# Patient Record
Sex: Male | Born: 1950
Health system: Southern US, Community
[De-identification: ages and names within clinical notes are randomized; demographics above are authoritative.]

## PROBLEM LIST (undated history)

## (undated) DIAGNOSIS — G8929 Other chronic pain: Secondary | ICD-10-CM

## (undated) DIAGNOSIS — K635 Polyp of colon: Secondary | ICD-10-CM

## (undated) DIAGNOSIS — F419 Anxiety disorder, unspecified: Secondary | ICD-10-CM

## (undated) DIAGNOSIS — J40 Bronchitis, not specified as acute or chronic: Secondary | ICD-10-CM

## (undated) DIAGNOSIS — G454 Transient global amnesia: Secondary | ICD-10-CM

## (undated) DIAGNOSIS — M549 Dorsalgia, unspecified: Secondary | ICD-10-CM

## (undated) DIAGNOSIS — H269 Unspecified cataract: Secondary | ICD-10-CM

## (undated) DIAGNOSIS — Z87442 Personal history of urinary calculi: Secondary | ICD-10-CM

## (undated) DIAGNOSIS — E785 Hyperlipidemia, unspecified: Secondary | ICD-10-CM

## (undated) DIAGNOSIS — IMO0002 Reserved for concepts with insufficient information to code with codable children: Secondary | ICD-10-CM

## (undated) DIAGNOSIS — R410 Disorientation, unspecified: Secondary | ICD-10-CM

## (undated) HISTORY — PX: VASECTOMY: SHX75

## (undated) HISTORY — PX: OTHER SURGICAL HISTORY: SHX169

## (undated) HISTORY — DX: Polyp of colon: K63.5

## (undated) HISTORY — PX: COLONOSCOPY: SHX174

## (undated) HISTORY — DX: Unspecified cataract: H26.9

## (undated) HISTORY — DX: Reserved for concepts with insufficient information to code with codable children: IMO0002

## (undated) HISTORY — DX: Hyperlipidemia, unspecified: E78.5

---

## 1998-04-05 ENCOUNTER — Encounter: Admission: RE | Admit: 1998-04-05 | Discharge: 1998-04-29 | Payer: Self-pay | Admitting: Internal Medicine

## 1998-07-11 ENCOUNTER — Encounter: Payer: Self-pay | Admitting: Neurological Surgery

## 1998-07-11 ENCOUNTER — Ambulatory Visit (HOSPITAL_COMMUNITY): Admission: RE | Admit: 1998-07-11 | Discharge: 1998-07-11 | Payer: Self-pay | Admitting: Neurological Surgery

## 1998-08-08 ENCOUNTER — Encounter: Admission: RE | Admit: 1998-08-08 | Discharge: 1998-11-06 | Payer: Self-pay

## 1999-07-10 ENCOUNTER — Encounter: Payer: Self-pay | Admitting: Neurology

## 1999-07-10 ENCOUNTER — Ambulatory Visit (HOSPITAL_COMMUNITY): Admission: RE | Admit: 1999-07-10 | Discharge: 1999-07-10 | Payer: Self-pay | Admitting: Neurology

## 1999-07-31 ENCOUNTER — Encounter: Payer: Self-pay | Admitting: Neurology

## 1999-07-31 ENCOUNTER — Ambulatory Visit (HOSPITAL_COMMUNITY): Admission: RE | Admit: 1999-07-31 | Discharge: 1999-07-31 | Payer: Self-pay | Admitting: Neurology

## 1999-09-04 ENCOUNTER — Encounter: Admission: RE | Admit: 1999-09-04 | Discharge: 1999-09-04 | Payer: Self-pay | Admitting: Internal Medicine

## 1999-09-04 ENCOUNTER — Encounter: Payer: Self-pay | Admitting: Internal Medicine

## 2001-11-24 ENCOUNTER — Encounter: Payer: Self-pay | Admitting: Internal Medicine

## 2001-11-24 ENCOUNTER — Encounter: Admission: RE | Admit: 2001-11-24 | Discharge: 2001-11-24 | Payer: Self-pay | Admitting: Internal Medicine

## 2006-12-13 ENCOUNTER — Observation Stay (HOSPITAL_COMMUNITY): Admission: EM | Admit: 2006-12-13 | Discharge: 2006-12-14 | Payer: Self-pay | Admitting: Emergency Medicine

## 2006-12-13 ENCOUNTER — Ambulatory Visit: Payer: Self-pay | Admitting: Internal Medicine

## 2006-12-13 ENCOUNTER — Encounter: Payer: Self-pay | Admitting: Internal Medicine

## 2006-12-17 ENCOUNTER — Encounter: Payer: Self-pay | Admitting: Internal Medicine

## 2006-12-17 ENCOUNTER — Ambulatory Visit: Payer: Self-pay | Admitting: Internal Medicine

## 2006-12-17 ENCOUNTER — Ambulatory Visit: Payer: Self-pay

## 2007-01-27 ENCOUNTER — Ambulatory Visit: Payer: Self-pay | Admitting: Internal Medicine

## 2008-07-19 ENCOUNTER — Ambulatory Visit: Payer: Self-pay | Admitting: Internal Medicine

## 2008-10-21 ENCOUNTER — Ambulatory Visit: Payer: Self-pay | Admitting: Internal Medicine

## 2009-04-22 ENCOUNTER — Ambulatory Visit: Payer: Self-pay | Admitting: Internal Medicine

## 2009-08-22 ENCOUNTER — Ambulatory Visit: Payer: Self-pay | Admitting: Internal Medicine

## 2009-08-31 ENCOUNTER — Ambulatory Visit (HOSPITAL_COMMUNITY): Admission: RE | Admit: 2009-08-31 | Discharge: 2009-08-31 | Payer: Self-pay | Admitting: Internal Medicine

## 2009-08-31 ENCOUNTER — Encounter: Admission: RE | Admit: 2009-08-31 | Discharge: 2009-08-31 | Payer: Self-pay | Admitting: Internal Medicine

## 2009-11-07 ENCOUNTER — Encounter (INDEPENDENT_AMBULATORY_CARE_PROVIDER_SITE_OTHER): Payer: Self-pay | Admitting: *Deleted

## 2009-11-09 ENCOUNTER — Ambulatory Visit: Payer: Self-pay | Admitting: Internal Medicine

## 2009-11-16 ENCOUNTER — Telehealth (INDEPENDENT_AMBULATORY_CARE_PROVIDER_SITE_OTHER): Payer: Self-pay | Admitting: *Deleted

## 2009-11-23 ENCOUNTER — Ambulatory Visit: Payer: Self-pay | Admitting: Internal Medicine

## 2009-11-24 ENCOUNTER — Encounter: Payer: Self-pay | Admitting: Internal Medicine

## 2009-12-22 ENCOUNTER — Ambulatory Visit: Payer: Self-pay | Admitting: Internal Medicine

## 2010-02-23 NOTE — Miscellaneous (Signed)
Summary: recall col....em/previsit  Clinical Lists Changes  Medications: Added new medication of MIRALAX   POWD (POLYETHYLENE GLYCOL 3350) As directed - Signed Added new medication of REGLAN 10 MG  TABS (METOCLOPRAMIDE HCL) As directed - Signed Added new medication of DULCOLAX 5 MG  TBEC (BISACODYL) As directed - Signed Rx of MIRALAX   POWD (POLYETHYLENE GLYCOL 3350) As directed;  #1 x 0;  Signed;  Entered by: Clide Cliff RN;  Authorized by: Hart Carwin MD;  Method used: [id number] [address], [place], [place], [place]  [address], Ph: [phone], Fax: [phone] Rx of REGLAN 10 MG  TABS (METOCLOPRAMIDE HCL) As directed;  #2 x 0;  Signed;  Entered by: Clide Cliff RN;  Authorized by: Hart Carwin MD;  Method used: Electronically to [id number] [address], [place], [place], [place]  [address], Ph: [phone], Fax: [phone] Rx of DULCOLAX 5 MG  TBEC (BISACODYL) As directed;  #4 x 0;  Signed;  Entered by: Clide Cliff RN;  Authorized by: Hart Carwin MD;  Method used: [facility] [id number] [address] [place], [place], [place]  [address], Ph: [phone], Fax: [phone] Observations: Added new observation of ALLERGY REV: Done (11/09/2009 8:24)    Prescriptions: DULCOLAX 5 MG  TBEC (BISACODYL) As directed  #4 x 0   Entered by:   Clide Cliff RN   Authorized by:   Hart Carwin MD   Signed by:   Clide Cliff RN on 11/09/2009   Method used:   [facility]ronically to        [facility] [phone][facility] (retail)       [address]       [place], [place]  [address]       Ph: [phone]       Fax: [phone]   RxID:   [id number] REGLAN 10 MG  TABS (METOCLOPRAMIDE HCL) As directed  #2 x 0   Entered by:   Clide Cliff RN   Authorized by:   Hart Carwin MD   Signed by:   Clide Cliff RN on 11/09/2009   Method used:   [facility]ronically [facility]   [facility] [facility] [address] [phone][facility] ([facility]ail)       [address]       [place], [place]  [address]       Ph: [phone]       Fax: [phone]   RxID:   [id number] MIRALAX   POWD (POLYETHYLENE GLYCOL 3350) As directed  #1 x 0   Entered by:   Clide Cliff RN   Authorized by:   Hart Carwin MD   Signed by:   Clide Cliff RN on 11/09/2009   Method used:   [facility]ron[facility]        [facility] [address] [phone] ([facility]ail)       [address]       [place], [place]  [address]       Ph: [phone]       Fax: [phone]   RxID:   [id number]

## 2010-02-23 NOTE — Progress Notes (Signed)
Summary: Questions about prep  Phone Note Call from Patient Call back at [phone]   Caller: Patient Call For: Dr. Juanda Chance Reason for Call: Talk to Nurse Summary of Call: Has some questions about his prep for COL...last COL he used a different type of prep Initial call taken by: Karna Christmas,  November 16, 2009 3:00 PM  Follow-up for Phone Call        Questions answered to pts. satisfaction. Follow-up by: Wyona Almas RN,  November 16, 2009 5:03 PM

## 2010-02-23 NOTE — Letter (Signed)
Summary: Patient Notice- Polyp Results  [facility]  [address] [place], [place] [address]   Phone: [phone]  Fax: [phone]        November 24, 2009 MRN: [id number]    [facility] [address] [place], [place]  [address]    Dear Mr. Kopera,  I am pleased to inform you that the colon polyp(s) removed during your recent colonoscopy was (were) found to be benign (no cancer detected) upon pathologic examination.The polyp was an adenoma ( precancerous)  I recommend you have a repeat colonoscopy examination in 5_ years to look for recurrent polyps, as having colon polyps increases your risk for having recurrent polyps or even colon cancer in the future.  Should you develop new or worsening symptoms of abdominal pain, bowel habit changes or bleeding from the rectum or bowels, please schedule an evaluation with either your primary care physician or with me.  Additional information/recommendations:  _x_ No further action with gastroenterology is needed at this time. Please      follow-up with your primary care physician for your other healthcare      needs.  __ Please call [phone] to schedule a return visit to review your      situation.  __ Please keep your follow-up visit as already scheduled.  __ Continue treatment plan as outlined the day of your exam.  Please call us if you are having persistent problems or have questions about your condition that have not been fully answered at this time.  Sincerely,  Hart Carwin MD  This letter has been electronically signed by your physician.  Appended Document: Patient Notice- Polyp Results letter mailed

## 2010-02-23 NOTE — Letter (Signed)
Summary: [facility] Instructions  [facility]  [address] [place], [place] [address]   Phone: [phone]  Fax: [phone]       Jerry Campbell    08-21-1950    MRN: [id number]       Procedure Day Dorna Bloom:  Wednesday 11/23/2009     Arrival Time: 9:00 am     Procedure Time: 10:00 am     Location of Procedure:                    _x _  [facility] (4th Floor)    PREPARATION FOR COLONOSCOPY WITH MIRALAX  Starting 5 days prior to your procedure Friday 10/28 do not eat nuts, seeds, popcorn, corn, beans, peas,  salads, or any raw vegetables.  Do not take any fiber supplements (e.g. Metamucil, Citrucel, and Benefiber). ____________________________________________________________________________________________________   THE DAY BEFORE YOUR PROCEDURE         DATE: Tuesday 11/1  1   Drink clear liquids the entire day-NO SOLID FOOD  2   Do not drink anything colored red or purple.  Avoid juices with pulp.  No orange juice.  3   Drink at least 64 oz. (8 glasses) of fluid/clear liquids during the day to prevent dehydration and help the prep work efficiently.  CLEAR LIQUIDS INCLUDE: Water Jello Ice Popsicles Tea (sugar ok, no milk/cream) Powdered fruit flavored drinks Coffee (sugar ok, no milk/cream) Gatorade Juice: apple, white grape, white cranberry  Lemonade Clear bullion, consomm, broth Carbonated beverages (any kind) Strained chicken noodle soup Hard Candy  4   Mix the entire bottle of Miralax with 64 oz. of Gatorade/Powerade in the morning and put in the refrigerator to chill.  5   At 3:00 pm take 2 Dulcolax/Bisacodyl tablets.  6   At 4:30 pm take one Reglan/Metoclopramide tablet.  7  Starting at 5:00 pm drink one 8 oz glass of the Miralax mixture every 15-20 minutes until you have finished drinking the entire 64 oz.  You should finish drinking prep around 7:30 or 8:00 pm.  8   If you are nauseated, you may take the 2nd Reglan/Metoclopramide  tablet at 6:30 pm.        9    At 8:00 pm take 2 more DULCOLAX/Bisacodyl tablets.     THE DAY OF YOUR PROCEDURE      DATE:  Wednesday 11/2  You may drink clear liquids until 8:00 am  (2 HOURS BEFORE PROCEDURE).   MEDICATION INSTRUCTIONS  Unless otherwise instructed, you should take regular prescription medications with a small sip of water as early as possible the morning of your procedure.        OTHER INSTRUCTIONS  You will need a responsible adult at least 58 years of age to accompany you and drive you home.   This person must remain in the waiting room during your procedure.  Wear loose fitting clothing that is easily removed.  Leave jewelry and other valuables at home.  However, you may wish to bring a book to read or an iPod/MP3 player to listen to music as you wait for your procedure to start.  Remove all body piercing jewelry and leave at home.  Total time from sign-in until discharge is approximately 2-3 hours.  You should go home directly after your procedure and rest.  You can resume normal activities the day after your procedure.  The day of your procedure you should not:   Drive   Make legal  decisions   Operate machinery   Drink alcohol   Return to work  You will receive specific instructions about eating, activities and medications before you leave.   The above instructions have been reviewed and explained to me by   Clide Cliff, RN______________________    I fully understand and can verbalize these instructions _____________________________ Date _______

## 2010-02-23 NOTE — Procedures (Signed)
Summary: Colonoscopy  Patient: [name] Note: All result statuses are Final unless otherwise noted.  Tests: (1) Colonoscopy (COL)   COL Colonoscopy           DONE     [facility]     [address]     [place], [place]  [address]           COLONOSCOPY PROCEDURE REPORT           PATIENT:  Jerry Campbell, Jerry Campbell  MR#:  [id number]     BIRTHDATE:  [birth year], 59 yrs. old  GENDER:  male     ENDOSCOPIST:  Hedwig Morton. Juanda Chance, MD     REF. BY:  Sharlet Salina, M.D.     PROCEDURE DATE:  11/23/2009     PROCEDURE:  Colonoscopy 62130     ASA CLASS:  Class II     INDICATIONS:  history of hyperplastic polyps hyperplastic polyp     2004     MEDICATIONS:   Versed 5 mg, Fentanyl 50 mcg           DESCRIPTION OF PROCEDURE:   After the risks benefits and     alternatives of the procedure were thoroughly explained, informed     consent was obtained.  Digital rectal exam was performed and     revealed no rectal masses.   The LB [id number]-[id number] endoscope     was introduced through the anus and advanced to the cecum, which     was identified by both the appendix and ileocecal valve, without     limitations.  The quality of the prep was good, using MiraLax.     The instrument was then slowly withdrawn as the colon was fully     examined.     <<PROCEDUREIMAGES>>           FINDINGS:  A sessile polyp was found. 3 mm sessile polyp at 70 cm     The polyp was removed using cold biopsy forceps (see image4).     This was otherwise a normal examination of the colon (see image5,     image3, image2, and image1).   Retroflexed views in the rectum     revealed no abnormalities.    The scope was then withdrawn from     the patient and the procedure completed.           COMPLICATIONS:  None     ENDOSCOPIC IMPRESSION:     1) Sessile polyp     2) Otherwise normal examination     RECOMMENDATIONS:     1) Await pathology results     2) High fiber diet.     REPEAT EXAM:  In 10 year(s) for.        ______________________________     Hedwig Morton. Juanda Chance, MD           CC:           n.     eSIGNED:   Hedwig Morton. Brodie at 11/23/2009 10:53 AM           [name], [id number]  Note: An exclamation mark (!) indicates a result that was not dispersed into the flowsheet. Document Creation Date: 11/23/2009 10:53 AM _______________________________________________________________________  (1) Order result status: Final Collection or observation date-time: 11/23/2009 10:47 Requested date-time:  Receipt date-time:  Reported date-time:  Referring Physician:   Ordering Physician: Lina Sar [phone]) Specimen Source:  Source: Launa Grill Order Number: [id number] Lab site:  Appended Document: Colonoscopy     Procedures Next Due Date:    Colonoscopy: 11/2014

## 2010-05-29 ENCOUNTER — Ambulatory Visit (INDEPENDENT_AMBULATORY_CARE_PROVIDER_SITE_OTHER): Payer: 59 | Admitting: Internal Medicine

## 2010-05-29 DIAGNOSIS — J309 Allergic rhinitis, unspecified: Secondary | ICD-10-CM

## 2010-05-29 DIAGNOSIS — J209 Acute bronchitis, unspecified: Secondary | ICD-10-CM

## 2010-06-06 NOTE — Consult Note (Signed)
NAME:  Jerry Campbell, Jerry Campbell                ACCOUNT NO.:  [id number]   MEDICAL RECORD NO.:  [id number]          PATIENT TYPE:  INP   LOCATION:  4740                         FACILITY:  [facility]   PHYSICIAN:  Gustavus Messing. Orlin Campbell, M.D.DATE OF BIRTH:  [birth year]   DATE OF CONSULTATION:  12/13/2006  DATE OF DISCHARGE:                                 CONSULTATION   CHIEF COMPLAINT:  Confusional spell with syncope, rule out seizure.   HISTORY OF PRESENT ILLNESS:  Jerry Campbell is a 60 year old right-handed  white man who was seen by Dr. Sandria Campbell in May 2001 for a confusional spell  with sudden onset and  gradual resolution over several hours.  He had a  workup which included an MRI scan which was negative and no further  workup or spells occurred until today, when he had a similar episode  just after giving a training session at work.  He cannot recall having  given the presentation and had difficulty recalling and retrieving  general information.  He went over to Dr. Beryle Campbell office, and there he  felt presyncopal and had a syncopal spell for a few minutes.  As he was  coming to, he reportedly had brief twitching of his hands and face but  no generalized tonic-clonic seizure activity.  He feels almost at  baseline at this time.   REVIEW OF SYSTEMS:  Fairly unremarkable.  Otherwise, he has some chronic  back pain.  No headaches, visual loss, double vision, swallowing  problems, any other  blackout spells or seizures other than this episode  that he had in 2001.  Chronic low back pain, occasionally radiating down  into his legs.   PAST MEDICAL HISTORY:  Fairly unremarkable.  No operations except for a  vasectomy.  He takes an aspirin once a day and some supplements like  chondroitin.   ALLERGIES:  NO KNOWN DRUG ALLERGIES.   SOCIAL HISTORY:  He is married, happy with his life.  He has one  daughter.  He works as an Soil scientist.  He uses occasional  caffeine and alcohol.  No cigarettes.   FAMILY  HISTORY:  Positive for stroke, diabetes, cholesterol, heart  disease and migraine.   OBJECTIVE:  VITAL SIGNS:  Blood pressure is 142/85, pulse 68,  respirations 18.  HEENT:  Head is normocephalic, atraumatic.  NECK:  Supple without bruits.  HEART:  Regular rate and rhythm.  LUNGS:  Clear to auscultation.  NEUROLOGIC:  Mental status:  He is awake, alert, oriented to age,  name,  date, day, month, year, season, hospital, city, county, state without  difficulty.  Registers 3/3 items and recalls 3/3 items.  Spells world  forward and backward.  Names objects, repeats, follows simple and  complex commands.  Cranial nerves:  Pupils are equal, reactive.  Visual  fields are full.  Extraocular movements are intact.  Facial sensation is  normal.  Facial motor activity is normal.  Hearing intact.  Palate  symmetric and tongue midline.  Motor exam:  He has no drift.  He has  normal bulk, tone, and strength throughout.  No fasciculations,  atrophy  or tremor.  Deep tendon reflexes are 2+ with downgoing toes.  Coordination:  Finger-to-nose, heel-to-shin normal.  Sensory normal.   MRI of the brain is normal.   IMPRESSION:  Confusional state followed by syncope, second event in the  last 8 years.  Rule out seizure.   RECOMMENDATIONS:  Would obtain an EEG; however this cannot be done  before Monday as an inpatient.  It would be all right to discharge the  patient and  follow up with the EEG and neuro followup as an outpatient  if he is stable for 24 hours.      Jerry Campbell, M.D.  Electronically Signed     CAW/MEDQ  D:  12/13/2006  T:  12/14/2006  Job:  [id number]

## 2010-06-06 NOTE — Consult Note (Signed)
NAME:  Jerry Campbell NO.:  [id number]   MEDICAL RECORD NO.:  [id number]          PATIENT TYPE:  INP   LOCATION:  4740                         FACILITY:  [facility]   PHYSICIAN:  Duke Salvia, MD, FACCDATE OF BIRTH:  19-Feb-1950   DATE OF CONSULTATION:  DATE OF DISCHARGE:  12/14/2006                                 CONSULTATION   REFERRING PHYSICIAN:  Dr. Luanna Cole. Baxley.   REASON FOR CONSULTATION:  Thank you much for asking me to see Jerry Campbell in consultation because of syncope.   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  He had been seen by Dr. Melbourne Abts at that time and no  specific diagnosis was made.  Because of the episode on this occasion, a  friend brought him to Dr. Beryle Quant office, who found that he was  oriented, but was confused, was unable to recall some of the specifics,  was aware, however, that he was confused.   She recommended admission.  He was sitting in a chair in her waiting  room.  He became hot, flushed, dizzy and apparently told the person  sitting next to him, here it comes again and then lost consciousness.  Dr. Lenord Fellers was called to the waiting room.  He was sitting in the chair,  flopped back. His eyes were open.  She positioned him to the floor.  Her  attempts to detect a pulse failed.  He did not appear to be breathing.  He then awoke rather abruptly, asking what was going on and he recalled  that at this hour, that that  was the first event after the episode.  EMS  was called.  We do not have initial vital signs by the nurse in the  office available to [place].  EMS diastolic blood pressures were in the 120s  and systolic blood pressures were in the 80s.  He was brought to the  emergency room.  Heart rates were as low as in the 40s.  He was  submitted for MRI scanning, which apparently was normal, and EEG is  recommended at this time.   The patient is quite fit, but not aerobically so.  He has had no prior  syncope.  He has not had a history of presyncope, although he did make  the comment that he recognized the flushing hot sensations, We all have  some of those, don't we?   In addition, it should be noted that his blood sugar was 120s upon  arrival of EMS.  PAST MEDICAL HISTORY:  His past medical history is largely related to  the above.   REVIEW OF SYSTEMS:  His medical review of systems is notable for some  change in the texture of his hair, nephrolithiasis and low back pain.   PAST SURGICAL HISTORY:  His surgeries are notable for tonsillectomy and  vasectomy.   MEDICATIONS:  He takes only aspirin.   ALLERGIES:  No known drug allergies.   SOCIAL HISTORY:  He is married.  He works at [employer].  He does not use  cigarettes or recreational drugs.  He does use alcohol occasionally.  He  has a  62-something-year-old daughter.   PHYSICAL EXAMINATION:  VITAL SIGNS:  His blood pressure earlier today  was 142/85, his pulse was 68, his respirations were 16.  GENERAL:  He is currently oriented x3.  HEENT:  Exam demonstrated icterus or xanthomata.  NECK:  The neck veins were flat.  The carotids were brisk and full  bilaterally without bruits.  BACK:  Without kyphosis or scoliosis.  LUNGS:  Clear.  HEART:  Heart sounds were regular without murmurs or gallops.  ABDOMEN:  Soft with active bowel sounds without midline pulsation or  hepatomegaly.  PULSES:  Femoral pulses were 2+.  Distal pulses were  intact.  EXTREMITIES:  There was no clubbing, cyanosis or edema.  NEUROLOGIC:  Exam was grossly normal.  SKIN:  Warm and dry.   ACCESSORY CLINICAL DATA:  Electrocardiograms obtained today in the  emergency room demonstrated sinus rhythm with rates ranging from the 60s  into the 50s.  A tracing from June in the office had a heart rate of 40  and apparently there are also heart rates of 40 in the emergency room.   IMPRESSION:  1. Syncopal episode, probably reflex, probably neurally mediated,      question trigger.  2. Recurrent confusional state, question cause.  3. Resting bradycardia noted previously in June, question relevance.  4  Anxiety associated with #2, question contributing to #1.   DISCUSSION:  Jerry Campbell had a syncopal episode following the onset of an  acute confusional state for which he was being intercurrently evaluated.   The likelihood that this represents a neurological event is low, based  on Dr. Bethann Goo assessment of the lack of focal neurological findings;  in addition, it is unlikely that it is endocrinological, given the fact  that his blood sugar was normal.   I think is also unlikely that it is cardiac, given his history and  electrocardiogram.  However, excluding structural heart disease is  essential, as this would have a major impact on the potential causes as  well as therapeutic options for syncope.   His bradycardia noted before is potentially a contributor, though I  think it is a red herring  However, making sure that it is not more  significant then I think is appropriate.   His wife raised the issue of tilt and I had not thought of that, but I  think it is actually a very good idea.  If we were to proceed with tilt  table testing to provoke a reflex syncopal episode and it reproduce the  prodrome, that would be very affirming of our hypothesis.   RECOMMENDATIONS:  Based on the above I would:  1. Proceed with an evaluation of structural heart  disease including:      a.     An echo.      b.     A stress Myoview.  2. Use the CardioNet to exclude a significant role for his      bradycardia.  3. Tilt-table testing.  4. Agree with EEG.  I question is there is anything specifically that      could be done to increase the sensitivity of a temporal lobe      episode.  5. No driving for now.  6. It is okay from my perspective to discharge in the morning if his      telemetry and we will arrange outpatient testing as noted above.   Thank your for the consultation.      Duke Salvia, MD, [facility]  Electronically Signed     SCK/MEDQ  D:  12/13/2006  T:  12/15/2006  Job:  [id number]   cc:   Luanna Cole. Lenord Fellers, M.D.  Genene Churn. Love, M.D.

## 2010-06-06 NOTE — H&P (Signed)
NAME:  Jerry Campbell, Jerry Campbell                ACCOUNT NO.:  [id number]   MEDICAL RECORD NO.:  [id number]          PATIENT TYPE:  INP   LOCATION:  4740                         FACILITY:  [facility]   PHYSICIAN:  Luanna Cole. Lenord Fellers, M.D.   DATE OF BIRTH:  1950-02-23   DATE OF ADMISSION:  12/13/2006  DATE OF DISCHARGE:                              HISTORY & PHYSICAL   CHIEF COMPLAINT:  Confusion.   BRIEF HISTORY:  This 60 year old white male, long time patient of mine  with no significant chronic medical problems except low back pain, was  giving a presentation with slides at [employer] around 9:30  this morning.  After presentation, he was talking to someone about his  presentation and could not remember he had covered a subject in the talk  he just gave.  He reviewed his slides and recognized the slides as his  old slides, but did not remember covering that subject in his talk.  He  had had similar episode of confusion in 2001 and saw Dr. Avie Echevaria for  evaluation.  Since that time, he has taken aspirin daily.  Dr. Sandria Manly did  an MRI of his brain and also an angio of his neck and that was within  normal limits.  Today, in the [place] nurses office, the patient was  discussing his previous episode with the nurse and later could not  remember it occurred in 2001 although it was just discussed that the  year was 2001 when it occurred.  His co-worker brought him to my office.  He was anxious and his blood pressure was 160/100, pulse 76 and regular.  He knows the month, day of week and year, but not date of the month.  The phone rang in my office and my nurse left the exam room to answer it  and then later he could not recall what they were discussing when she  returned to the exam room.  He is alert; his speech is not slurred.  He  has no gross focal deficits on neurological exam at this time.  His  fundi are benign.  His disks are sharp and flat.  His extraocular  movements are full.  I have  spoken with him and we have spoken with his  wife who is an Charity fundraiser at [employer] by telephone.  I think he would be best  served by going to the emergency department and getting a CT of his  brain.  While I was working on some paperwork to facilitate his  evaluation in the emergency department, patient was sitting in my  waiting room with his co-worker.  Patient was reading a golf magazine  and conversing with his co-worker when he suddenly remarked that he felt  a spell coming on.  He had a syncopal episode in the waiting room chair.  His head was rolled backward, but his eyes were open and he was starring  straight ahead with a fixed gaze.  Pupils were mid-size.  I did not  think he was breathing at the time and I could not obtain a radial  pulse.  I pulled him on to the floor of the waiting room, continued to  shake and shout to arouse him, and he suddenly came to and yelled where  am I in a startled manner.  The nurse came and then took his vital  signs and they were stable.  We called EMS for transport.  He had  suggestion of seizure-like activity just before he came around.  His  arms had some mild tonic-clonic activity as did his legs and his mouth  twitched a bit.  Once he came around, he seemed to be neurologically  stable although he could not remember exactly what had happened.  This  made him even more anxious.  EMS arrived and checked his Accu-Chek and  it was 123.  We monitored him with lead-2 on an EKG machine and it was  stable at the time with sinus rhythm.  We started IV fluids consisting  of lactated ringers at 50 ml an hour and transported him to [facility].  He was evaluated in the emergency department and CT was  negative.  His lab work was within normal limits.   Patient had a complete physical exam in my office July 08, 2006 and labs  were within normal limits with exception of an LDL of 136 and a total  cholesterol of 205.  TSH at that time was 2.555.  He  has a history of  hyperplastic colon polyp which was removed on colonoscopy in 2004.  He  had a vasectomy in 1991 and he has a history of degenerative disc  disease at the LS spine.   ADDITIONAL PAST MEDICAL HISTORY:  1. History of fractured left wrist in November of 1995.  2. Fractured left fifth toe late 1980's.  3. Fractured right fifth toe early 1990's.  4. Left shoulder injury December of 2005 with a fracture of the      greater tuberosity.   In 2001, he had an episode of confusion and amnesia starting late  morning May 23, 1999 with difficulty focusing.  He looked at golf  schedule containing co-workers names and had difficulty recognizing  names.  He had a problem with a staff meeting, getting his words out and  could not recall the year.  Patient did have significant head trauma in  1995.  He fell off of some stairs or ladder with loss of consciousness  for some 15 to 20 minutes and was felt to have a concussion.   SOCIAL HISTORY:  He is married.  He has a Master's Degree.  He has  worked at [employer] for about 30 years.  Has occasional alcohol  consumption.  Will rarely smoke a cigarette.  He is employed as an  Soil scientist.   FAMILY HISTORY:  Father with history of MI at age 39, history of  hypertension and increased cholesterol and prostate cancer.  Reportedly  has a brother who had a history of MI at age 79.  Paternal grandmother  with diabetes mellitus and history of CVA.  No family history of  dementia.  Mother with history of migraine, hypertension and  hypothyroidism.  Patient had a negative exercise tolerance test in 2004  by Dr. Garnette Scheuermann.   REVIEW OF SYSTEMS:  History of chronic low back pain; MRI in 1999 of the  LS spine; he had a left disc herniation affecting the S1 nerve root; MR  angio of the neck July 31, 1999 was negative; last EKG on file  June of  2008 within normal limits except for rate of 40s.  He was asymptomatic  at the time.  He has had  previous EKG in my office several years ago  with the rate in the 40s as well.  He does do a lot of shag dancing and  some workout with exercise equipment at home and I felt the sinus rhythm  being in the 40s was due to his being in fairly good physical shape.  In  2007, he had multiple actinic keratoses on his face, scalp and neck  treated with liquid nitrogen by Dr. Suan Halter.  MRI of the brain done by  Dr. Sandria Manly July 10, 1999 was within normal limits.   PHYSICAL EXAMINATION:  GENERAL:  Patient appears anxious and disturbed  that he cannot remember as well as he should.  VITAL SIGNS:  Pulse 76, regular, blood pressure 160/100 left arm.  SKIN:  Fair, warm and dry.  NODES:  None.  HEENT:  Head is normocephalic, atraumatic.  Sclerae and conjunctivae are  clear.  TMs are clear.  Oropharynx is clear.  PERLA.  Extraocular  movements are full.  NECK:  Supple.  No bruits are present.  No thyromegaly.  CHEST:  Clear.  CARDIAC EXAM:  Regular rate and rhythm.  Normal S1 and S2 without  murmurs or gallops or rubs.  ABDOMEN:  No hepatosplenomegaly, masses or tenderness.  EXTREMITIES:  No lower extremity edema or deformity.  NEURO:  He is alert and oriented x3.  No facial asymmetry.  Gait is  normal.  Deep tendon reflex is 2+ and symmetrical.  Grips are equal  bilaterally.  Muscle strength 5/5 in all groups tested.   IMPRESSION:  1. Recurrence of confusional state that he previously had in May of      2001, ? transient global amnesia, ? transient ischemic attack, ?      anxiety.  2. Syncopal episode, ? cardiac origin, ? neurological origin, ?      vasovagal syncope.   PLAN:  Patient will be transported by EMS to the emergency department  for evaluation and admission with telemetry monitoring, cardiology  consultation and neurology consultation.  After CT, we will definitely  pursue MRI of the brain, 2-D echocardiogram and EEG.           ______________________________  Luanna Cole. Lenord Fellers,  M.D.     MJB/MEDQ  D:  12/14/2006  T:  12/14/2006  Job:  [id number]   cc:   Lyn Records, M.D.  Duke Salvia, MD, [facility]  Genene Churn. Love, M.D.

## 2010-06-06 NOTE — Discharge Summary (Signed)
NAME:  Jerry Campbell, Jerry Campbell                ACCOUNT NO.:  [id number]   MEDICAL RECORD NO.:  [id number]          PATIENT TYPE:  INP   LOCATION:  4740                         FACILITY:  [facility]   PHYSICIAN:  Luanna Cole. Lenord Fellers, M.D.   DATE OF BIRTH:  05-24-1950   DATE OF ADMISSION:  12/13/2006  DATE OF DISCHARGE:  12/14/2006                               DISCHARGE SUMMARY   CONSULTANTS:  1. Catherine A. Orlin Hilding, M.D., neurologist.  2. Duke Salvia, MD, [facility], cardiologist.   FINAL DIAGNOSES:  1. Syncope.  2. Confusional state.   CONDITION:  Improved and stable.   FOLLOW UP:  The patient to have outpatient cardiology evaluation next  week to include Cardiolite study and 2-D echocardiogram.  Also an EEG  will be performed to rule out seizure disorder.  The patient will not  drive until these tests have been completed and interpreted.  The  patient will also were cardiac diet.  Monitor for evaluation of possible  bit dysrhythmia.   BRIEF HISTORY:  This 59 year old white male had onset of confusional  state yesterday morning at work after giving a presentation for a group  of coworkers involving slide presentation.  He had had similar problem  in May 2001, which was evaluated by neurologist with MRI of the brain  and MRI angio of the neck, both of which were normal.  He had been on a  daily aspirin 81 mg since that time with no further recurrence of his  confusional state.  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  He came to the office for evaluation.  Was arranging for him  to be evaluated in the emergency department with a CT of the brain and  telemetry monitoring as well as lab work when he had a syncopal episode  while sitting in the waiting room in the office.  The syncopal episode  occurred suddenly while he was speaking with a co-worker and looking at  a Theatre stage manager.  He mentioned that something was happening to him.  His  head rolled backwards, but his eyes never  closed and he stared straight  ahead with his pupils midsize.  He could not be aroused with shaking or  shouting while sitting in a chair.  I then got him on the floor of the  waiting room and continued to shake and shout for a brief period of  time, thinking that perhaps CPR would be needed.  I could not get a  radial pulse and I did not think he was breathing.  He began to have  some very mild seizure-like activity of the upper extremities and lower  extremities slightly tonic-clonic and his mouth twisted a bit.  He had  no urinary incontinence.  He did not vomit.  He suddenly startled and  awakened and said where am I.  At that time, we called for EMS  transport, started IV fluids and got him on the monitor.  His vital  signs were stable at that time and his Accu-Chek was 123.  He was  transported to the emergency  department at [facility] and was evaluated by Dr. Stacie Acres.  His lab work was normal  including CBC and CMET.  CT of the brain was negative.  He subsequently  had an MRI of the brain which was also negative.  He was seen and  admitted and placed on telemetry monitoring.  Dr. Orlin Hilding saw him and  felt that he had not had a TIA or transient global amnesia.  She  recommended EEG.  This will need to be done as an outpatient to rule out  seizure disorder.  He will not drive at the meantime.  Dr. Graciela Husbands saw him  in consultation regarding the syncopal episode.  Dr. Graciela Husbands is going to  pursue evaluation with 2-D echocardiogram, Cardiolite study and possible  tilt table study.  This will be arranged next week.  The patient was  stable overnight and the day following admission said he felt fine.  Lowest pulse was 46.  Repeat EKG showed sinus bradycardia on the day of  discharge with no other abnormalities.  CK-MB fractions were negative x3  and troponin I was not elevated.  The patient will be discharged home  with his wife who is an Charity fundraiser and with the lot cardiac  experience.  She  will call if symptoms recur.  We will arrange for the outpatient for the  next week insignia 2 weeks and he will not work until these studies have  been interpreted.  He will not drive until we have had a chance to  review these studies and gave our clearance as well.  He will continue  with one aspirin 81 mg daily.           ______________________________  Luanna Cole. Lenord Fellers, M.D.     MJB/MEDQ  D:  12/14/2006  T:  12/15/2006  Job:  [id number]   cc:   Santina Evans A. Orlin Hilding, M.D.  Duke Salvia, MD, [facility]  Lyn Records, M.D.

## 2010-06-06 NOTE — Letter (Signed)
January 27, 2007    Luanna Cole. Lenord Fellers, M.D.  [address], [place]  [place], [place] [address]   RE:  ALEZANDER, DIMAANO  MRN:  [id number]  /  DOB:  09-02-1950   Dear Eden Emms,   Mr. Weida comes in today with his wife Patty in follow-up for his  episode of confusion and syncope.  I suspect that the episode in your  office was neurally mediated, the explanation of the confusion remains  cryptic.  At this point, I am not sure that there is anything further  that needs to be done from a cardiology perspective.  We discussed a  long time about the implications of his mild bradycardia and I am not  sure that I would pursue anything with that given its mild nature and  the unclear association with any events.   On examination today, his blood pressure was 119/72, his pulse was 54.  His lungs were clear.  Heart sounds were regular and extremities were  without edema.   I have not set any follow-up up with me.  Eden Emms if there is anything  further I can do to help in his care, please do not hesitate to  contact  me.    Sincerely,      Duke Salvia, MD, [facility]  Electronically Signed    SCK/MedQ  DD: 01/27/2007  DT: 01/28/2007  Job #: [id number]

## 2010-06-15 ENCOUNTER — Ambulatory Visit
Admission: RE | Admit: 2010-06-15 | Discharge: 2010-06-15 | Disposition: A | Discharge status: Home / Self Care | Payer: 59 | Source: Ambulatory Visit | Attending: Internal Medicine | Admitting: Internal Medicine

## 2010-06-15 ENCOUNTER — Telehealth: Payer: Self-pay | Admitting: *Deleted

## 2010-06-15 DIAGNOSIS — R05 Cough: Secondary | ICD-10-CM

## 2010-06-15 DIAGNOSIS — R059 Cough, unspecified: Secondary | ICD-10-CM

## 2010-06-15 NOTE — Telephone Encounter (Signed)
Left message for patient to call. Chest X-Ray has been ordered.

## 2010-06-15 NOTE — Telephone Encounter (Signed)
Pt finished Levaquin and Prednisone dose pack about 2 weeks ago.  He is concerned that he still has what he describes as a "nagging cough."  Pt reports no others symptoms. Wants to know if you have any suggestions or would you like him to come in for an OV?

## 2010-06-15 NOTE — Telephone Encounter (Signed)
Should be better by now. This still could be allergy or GE reflux. I believe we gave him Dr. Lyla Son phone number [phone].  Pt may call for appt for allergy evaluation. We can also do a ChestXray for prolonged cough.

## 2010-06-15 NOTE — Telephone Encounter (Signed)
Spoke with patient regarding chest x-ray and seeing Dr. Morris Callas.

## 2010-06-28 ENCOUNTER — Telehealth: Payer: Self-pay

## 2010-06-28 NOTE — Telephone Encounter (Signed)
Patient called regarding his recent chest Xray after being diagnosed with allergic bronchitis. Informed it was within normal limits- no pneumonia, per Dr. Lenord Fellers. Advised he needs to followup with allergist or pulmonologist for further problems. He states he has an appt with Dr. Cooleemee Callas in July

## 2010-08-28 ENCOUNTER — Encounter: Payer: 59 | Admitting: Internal Medicine

## 2010-09-07 ENCOUNTER — Encounter: Payer: Self-pay | Admitting: Internal Medicine

## 2010-09-11 ENCOUNTER — Encounter: Payer: Self-pay | Admitting: Internal Medicine

## 2010-09-11 ENCOUNTER — Ambulatory Visit (INDEPENDENT_AMBULATORY_CARE_PROVIDER_SITE_OTHER): Payer: 59 | Admitting: Internal Medicine

## 2010-09-11 DIAGNOSIS — M5137 Other intervertebral disc degeneration, lumbosacral region: Secondary | ICD-10-CM

## 2010-09-11 DIAGNOSIS — Z87898 Personal history of other specified conditions: Secondary | ICD-10-CM

## 2010-09-11 DIAGNOSIS — J309 Allergic rhinitis, unspecified: Secondary | ICD-10-CM

## 2010-09-11 DIAGNOSIS — Z125 Encounter for screening for malignant neoplasm of prostate: Secondary | ICD-10-CM

## 2010-09-11 DIAGNOSIS — M5136 Other intervertebral disc degeneration, lumbar region: Secondary | ICD-10-CM

## 2010-09-11 DIAGNOSIS — K429 Umbilical hernia without obstruction or gangrene: Secondary | ICD-10-CM

## 2010-09-11 DIAGNOSIS — K635 Polyp of colon: Secondary | ICD-10-CM

## 2010-09-11 DIAGNOSIS — E785 Hyperlipidemia, unspecified: Secondary | ICD-10-CM

## 2010-09-11 DIAGNOSIS — Z0000 Encounter for general adult medical examination without abnormal findings: Secondary | ICD-10-CM

## 2010-09-11 DIAGNOSIS — D126 Benign neoplasm of colon, unspecified: Secondary | ICD-10-CM

## 2010-09-11 LAB — HEPATIC FUNCTION PANEL
AST: 19 U/L (ref 0–37)
Alkaline Phosphatase: 44 U/L (ref 39–117)
Indirect Bilirubin: 0.8 mg/dL (ref 0.0–0.9)
Total Protein: 7 g/dL (ref 6.0–8.3)

## 2010-09-11 LAB — BASIC METABOLIC PANEL
CO2: 30 mEq/L (ref 19–32)
Calcium: 9.5 mg/dL (ref 8.4–10.5)
Glucose, Bld: 80 mg/dL (ref 70–99)
Potassium: 3.9 mEq/L (ref 3.5–5.3)
Sodium: 142 mEq/L (ref 135–145)

## 2010-09-11 LAB — LIPID PANEL
HDL: 47 mg/dL (ref >39)
LDL Cholesterol: 70 mg/dL (ref 0–99)
Triglycerides: 71 mg/dL (ref <150)
VLDL: 14 mg/dL (ref 0–40)

## 2010-09-11 LAB — CBC WITH DIFFERENTIAL/PLATELET
Eosinophils Absolute: 0.1 10*3/uL (ref 0.0–0.7)
Lymphs Abs: 1.7 10*3/uL (ref 0.7–4.0)
MCHC: 33.9 g/dL (ref 30.0–36.0)
Monocytes Absolute: 0.3 10*3/uL (ref 0.1–1.0)
Neutro Abs: 3.1 10*3/uL (ref 1.7–7.7)
Neutrophils Relative %: 58 % (ref 43–77)
Platelets: 198 10*3/uL (ref 150–400)

## 2010-09-11 LAB — POCT URINALYSIS DIPSTICK
Leukocytes, UA: NEGATIVE
Nitrite, UA: NEGATIVE
Protein, UA: NEGATIVE
pH, UA: 6.5

## 2010-09-12 ENCOUNTER — Encounter: Payer: Self-pay | Admitting: Internal Medicine

## 2010-09-22 ENCOUNTER — Encounter: Payer: Self-pay | Admitting: Internal Medicine

## 2010-09-22 DIAGNOSIS — M5136 Other intervertebral disc degeneration, lumbar region: Secondary | ICD-10-CM | POA: Insufficient documentation

## 2010-09-22 DIAGNOSIS — Z87898 Personal history of other specified conditions: Secondary | ICD-10-CM | POA: Insufficient documentation

## 2010-09-22 DIAGNOSIS — E785 Hyperlipidemia, unspecified: Secondary | ICD-10-CM | POA: Insufficient documentation

## 2010-09-22 DIAGNOSIS — J309 Allergic rhinitis, unspecified: Secondary | ICD-10-CM | POA: Insufficient documentation

## 2010-09-22 NOTE — Progress Notes (Signed)
Subjective:    Patient ID: Jerry Campbell, male    DOB: November 14, 1950, 60 y.o.   MRN: [id number]  HPI 60 year old white male with history of hyperlipidemia and degenerative disc disease lumbar spine for health maintenance exam. No known drug allergies. Is on Zocor 10 mg daily. History of fractured left wrist secondary to a fall 1995. History of fractured left fifth toe late 1980s, history of fractured right toe in karate early 1990s, fractured left shoulder with possible rotator cuff today her December 2005. Had colonoscopy 2004. Had facetectomy 1991. Tetanus immunization June 2010. In July 2012 had allergy consultation for lingering cough. Spiriva tree revealed a mild restrictive pattern. Skin testing showed significant results to dust mites and cockroach house dust mold mix is. He was treated with a Proventil inhaler.  He was hospitalized in [place] June 60 with an episode of confusion and forgetfulness. Work up in [place] and this is to do a CT scan of the brain which was negative. This was his third episode of confusion in his life. He was admitted to come in hospital November 2008 with a similar presentation but had syncope. He had a very thorough evaluation by doctors Graciela Husbands in love. Symptoms started as a confusional state at work after giving a presentation to a group of coworkers. In 2001 he had an episode of confusion and amnesia starting in late morning with difficulty focusing. Once again he was in a staff meeting and had difficulty getting his words out and could not recall the year. Was felt to have a concussion after a fall off of a ladder 1995.  History of hyperplastic colon polyp.EEG in 2008 was negative. 2-D echo in 2008 was negative. Cardiolite study in 2008 was negative. MRI of the brain with and without contrast August 2011 was normal.    Review of Systems  Constitutional: Negative.   HENT: Negative.        Lingering cough for several months recently attributed to allergic  rhinitis  Eyes: Negative.   Cardiovascular: Negative.   Gastrointestinal: Negative.   Genitourinary: Negative.   Musculoskeletal: Negative.   Neurological: Negative.   Hematological: Negative.   Psychiatric/Behavioral: Negative.        Objective:   Physical Exam  Constitutional: He is oriented to person, place, and time. He appears well-developed and well-nourished.  HENT:  Head: Normocephalic and atraumatic.  Right Ear: External ear normal.  Left Ear: External ear normal.  Nose: Nose normal.  Mouth/Throat: Oropharynx is clear and moist.  Eyes: Conjunctivae and EOM are normal. Pupils are equal, round, and reactive to light. Right eye exhibits no discharge. Left eye exhibits no discharge. No scleral icterus.  Neck: Neck supple. No JVD present. No thyromegaly present.  Cardiovascular: Normal rate, regular rhythm, normal heart sounds and intact distal pulses.   No murmur heard. Pulmonary/Chest: Breath sounds normal. He has no wheezes. He has no rales.  Abdominal: Soft. Bowel sounds are normal. He exhibits no distension and no mass. There is no tenderness. There is no rebound.  Genitourinary: Rectum normal and prostate normal.  Musculoskeletal: He exhibits no edema.  Lymphadenopathy:    He has no cervical adenopathy.  Neurological: He is alert and oriented to person, place, and time. He has normal reflexes. He displays normal reflexes. No cranial nerve deficit.  Skin: Skin is warm and dry. No rash noted.  Psychiatric: He has a normal mood and affect. His behavior is normal.   Small umbilical hernia reducible  Assessment & Plan:  Allergic rhinitis  Degenerative disc disease lumbar spine  Hyperplastic colon polyp  Hyperlipidemia  Umbilical hernia  3 episodes of confusional states-etiology unclear occurring 2001, 2008, 2011.  Plan continue Zocor 10 mg daily and recheck lipid panel liver functions with office visit in 6 months. He will speak with his wife and let he  know which surgeon he would like to say regarding umbilical hernia repair

## 2010-09-28 ENCOUNTER — Ambulatory Visit (INDEPENDENT_AMBULATORY_CARE_PROVIDER_SITE_OTHER): Payer: 59 | Admitting: General Surgery

## 2010-09-28 ENCOUNTER — Encounter (INDEPENDENT_AMBULATORY_CARE_PROVIDER_SITE_OTHER): Payer: Self-pay | Admitting: General Surgery

## 2010-09-28 VITALS — BP 138/84 | HR 70 | Temp 97.8°F | Ht 68.0 in | Wt 163.6 lb

## 2010-09-28 DIAGNOSIS — Z01818 Encounter for other preprocedural examination: Secondary | ICD-10-CM

## 2010-09-28 DIAGNOSIS — K429 Umbilical hernia without obstruction or gangrene: Secondary | ICD-10-CM

## 2010-09-28 NOTE — Progress Notes (Signed)
Subjective:     Patient ID: Jerry Campbell, male   DOB: Jan 02, 1951, 60 y.o.   MRN: [id number]  HPIPatient is a 60 year old Caucasian male referred by Sharlet Salina for a mildly symptomatic umbilical hernia. He's recently had a physical exam and Dr. Lenord Fellers and noted that the hernia was increasing in size he has not had any episodes of pain that prevented him from any type of physical activity he's never had to reduce the hernia the hernia is noticeable through a golf shirt and would like to have it surgically corrected his recent physical exam is well detailed in Dr. Lenord Fellers review of systems is all negative and the only episodes of slightly unusual he's had 2 or 3 little mild syncopal episodes appears to be not prevent him from driving or anything of significance he does have mainly short periods of memory loss. He's been evaluated by Dr. love was no definite diagnosis made and he's not any type of physical activity restrictions because of this. He does take a baby aspirin but it was not onrecomended by Dr. Sandria Manly was recommendation.  HPI 60 year old white male with history of hyperlipidemia and degenerative disc disease lumbar spine for health maintenance exam. No known drug allergies. Is on Zocor 10 mg daily. History of fractured left wrist secondary to a fall 1995. History of fractured left fifth toe late 1980s, history of fractured right toe in karate early 1990s, fractured left shoulder with possible rotator cuff today her December 2005. Had colonoscopy 2004. Had facetectomy 1991. Tetanus immunization June 2010. In July 2012 had allergy consultation for lingering cough. Spiriva tree revealed a mild restrictive pattern. Skin testing showed significant results to dust mites and cockroach house dust mold mix is. He was treated with a Proventil inhaler.   He was hospitalized in [place] June 2011 with an episode of confusion and forgetfulness. Work up in [place] and this is to do a CT scan of the brain  which was negative. This was his third episode of confusion in his life. He was admitted to come in hospital November 2008 with a similar presentation but had syncope. He had a very thorough evaluation by doctors Graciela Husbands in love. Symptoms started as a confusional state at work after giving a presentation to a group of coworkers. In 2001 he had an episode of confusion and amnesia starting in late morning with difficulty focusing. Once again he was in a staff meeting and had difficulty getting his words out and could not recall the year. Was felt to have a concussion after a fall off of a ladder 1995.   History of hyperplastic colon polyp.EEG in 2008 was negative. 2-D echo in 2008 was negative. Cardiolite study in 2008 was negative. MRI of the brain with and without contrast August 2011 was normal.       Review of Systems  Constitutional: Negative.   HENT: Negative.         Lingering cough for several months recently attributed to allergic rhinitis  Eyes: Negative.   Cardiovascular: Negative.   Gastrointestinal: Negative.   Genitourinary: Negative.   Musculoskeletal: Negative.   Neurological: Negative.   Hematological: Negative.   Psychiatric/Behavioral: Negative.       Objective:    Physical Exam  Constitutional: He is oriented to person, place, and time. He appears well-developed and well-nourished.  HENT:   Head: Normocephalic and atraumatic.  Right Ear: External ear normal.  Left Ear: External ear normal.   Nose: Nose normal.  Mouth/Throat: Oropharynx is clear and moist.  Eyes: Conjunctivae and EOM are normal. Pupils are equal, round, and reactive to light. Right eye exhibits no discharge. Left eye exhibits no discharge. No scleral icterus.  Neck: Neck supple. No JVD present. No thyromegaly present.  Cardiovascular: Normal rate, regular rhythm, normal heart sounds and intact distal pulses.    No murmur heard. Pulmonary/Chest: Breath sounds normal. He has no wheezes. He has no  rales.  Abdominal: Soft. Bowel sounds are normal. He exhibits no distension and no mass. There is no tenderness. There is no rebound.  Genitourinary: Rectum normal and prostate normal.  Musculoskeletal: He exhibits no edema.  Lymphadenopathy:    He has no cervical adenopathy.  Neurological: He is alert and oriented to person, place, and time. He has normal reflexes. He displays normal reflexes. No cranial nerve deficit.  Skin: Skin is warm and dry. No rash noted.  Psychiatric: He has a normal mood and affect. His behavior is normal.  Small umbilical hernia reducible         Assessment & Plan:    Allergic rhinitis   Degenerative disc disease lumbar spine   Hyperplastic colon polyp   Hyperlipidemia   Umbilical hernia   3 episodes of confusional states-etiology unclear occurring 2001, 2008, 2011.   Plan continue Zocor 10 mg daily and recheck lipid panel liver functions with office visit in 6 months. He will speak with his wife and let he know which surgeon he would like to say regarding umbilical hernia repair        Electronic signature on 09/11/2010               t                             Other Encounter Related Information     Allergies & Medications         Problem List         History         Patient-Entered Questionnaires     No data filed         Review of Systems     Objective:   Physical ExamBP 138/84  Pulse 70  Temp(Src) 97.8 F (36.6 C) (Temporal)  Ht 5\' 8"  (1.727 m)  Wt 163 lb 9.6 oz (74.208 kg)  BMI 24.88 kg/m2  Patient is well oriented to time and place. Physical exam his lungs are clear cardiac normal so rhythm is no carotid bruits abdomen is soft nontender he has about a headaches as umbilical defect protrudes when he strains but reduces spontaneously. He does not have any left or right inguinal hernias that I can appreciate and I did not do a rectal examination as this has been recently performed by Dr.  Lenord Fellers and he's also had a colonoscopy.  CNS and neurologic exam is grossly normal there are reviewed the little hernia booklet with the patient and discussed the various options of mesh and will probably use of small ventral X. patch kit and I showed him samples of a similar product. We'll do this with general anesthesia he is aware that we'll be using an endotracheal tube and that he'll need to be off work probably approximately a week. Discussed with him that there is no episodes of prolonged the walls I don't think any of the studies would be needed but he is aware that the pain medications and etc. may make these episodes little  more frequent.     Assessment:    Patient has a small umbilical hernia that has not truly symptomatic but it is enlarging and he desires to have it surgically corrected. He'll follow to the schedule as and I think this can be done anytime in the near future he is aware that of a generalized seizure his wife and he did come with him to drive him home and work with his coworkers about probably one week all following the procedure. He does not appear that he's had a recent EKG which we'll do at day surgery.     Plan:    Patient will talk with the schedulers  will probably cause back up when he tried to have a surgical procedure scheduled.The schedule she was filled at we'll plan to use [facility] surgical center.

## 2010-09-28 NOTE — Patient Instructions (Signed)
Talk with the scheduler and plan to be off probably one week after the procedure before returning to work.

## 2010-10-03 ENCOUNTER — Encounter (INDEPENDENT_AMBULATORY_CARE_PROVIDER_SITE_OTHER): Payer: 59 | Admitting: General Surgery

## 2010-10-06 LAB — BASIC METABOLIC PANEL
CO2: 32 mEq/L (ref 19–32)
Calcium: 10.8 mg/dL — ABNORMAL HIGH (ref 8.4–10.5)
Chloride: 104 mEq/L (ref 96–112)
GFR calc Af Amer: >60 mL/min (ref >60)
Glucose, Bld: 95 mg/dL (ref 70–99)
Potassium: 4.4 mEq/L (ref 3.5–5.1)
Sodium: 143 mEq/L (ref 135–145)

## 2010-10-06 LAB — DIFFERENTIAL
Basophils Relative: 1 % (ref 0–1)
Eosinophils Absolute: 0.2 10*3/uL (ref 0.0–0.7)
Eosinophils Relative: 2 % (ref 0–5)
Lymphs Abs: 2.1 10*3/uL (ref 0.7–4.0)
Neutrophils Relative %: 62 % (ref 43–77)

## 2010-10-06 LAB — CBC
MCHC: 34.9 g/dL (ref 30.0–36.0)
RDW: 13.1 % (ref 11.5–15.5)

## 2010-10-10 ENCOUNTER — Ambulatory Visit (HOSPITAL_BASED_OUTPATIENT_CLINIC_OR_DEPARTMENT_OTHER)
Admission: RE | Admit: 2010-10-10 | Discharge: 2010-10-10 | Disposition: A | Discharge status: Home / Self Care | Payer: 59 | Source: Ambulatory Visit | Attending: General Surgery | Admitting: General Surgery

## 2010-10-10 DIAGNOSIS — Z79899 Other long term (current) drug therapy: Secondary | ICD-10-CM | POA: Insufficient documentation

## 2010-10-10 DIAGNOSIS — K429 Umbilical hernia without obstruction or gangrene: Secondary | ICD-10-CM

## 2010-10-10 DIAGNOSIS — Z01812 Encounter for preprocedural laboratory examination: Secondary | ICD-10-CM | POA: Insufficient documentation

## 2010-10-10 HISTORY — PX: UMBILICAL HERNIA REPAIR: SUR1181

## 2010-10-16 ENCOUNTER — Encounter (INDEPENDENT_AMBULATORY_CARE_PROVIDER_SITE_OTHER): Payer: 59

## 2010-10-16 ENCOUNTER — Encounter (INDEPENDENT_AMBULATORY_CARE_PROVIDER_SITE_OTHER): Payer: Self-pay | Admitting: General Surgery

## 2010-10-16 ENCOUNTER — Ambulatory Visit (INDEPENDENT_AMBULATORY_CARE_PROVIDER_SITE_OTHER): Payer: 59 | Admitting: General Surgery

## 2010-10-16 VITALS — BP 124/82 | HR 80 | Temp 97.0°F | Ht 68.0 in | Wt 163.8 lb

## 2010-10-16 DIAGNOSIS — K429 Umbilical hernia without obstruction or gangrene: Secondary | ICD-10-CM

## 2010-10-16 NOTE — Progress Notes (Signed)
Subjective:     Patient ID: Jerry Campbell, male   DOB: 1950/06/22, 60 y.o.   MRN: [id number]  HPIPatient returns now proximally 10 days following a umbilical hernia repair with small hernia patch He is here today for suture removal and incision appears to be doing nicely. I had recommended waiting until Wednesday before showering since a couple of stitches I made a candidate and he is going to go visit his child in [place] Wednesday    Review of Systems     Objective:   Physical ExamUmbilical hernia repair which is healing nicely, followup visit for final visit in 3 weeks.     Assessment:        Plan:

## 2010-10-17 ENCOUNTER — Encounter (INDEPENDENT_AMBULATORY_CARE_PROVIDER_SITE_OTHER): Payer: 59

## 2010-10-18 NOTE — Op Note (Signed)
NAME:  OJAS, COONE NO.:  [id number]  MEDICAL RECORD NO.:  [id number]  LOCATION:                                 FACILITY:  PHYSICIAN:  Anselm Pancoast. Pierra Skora, M.D.DATE OF BIRTH:  March 21, 1950  DATE OF PROCEDURE:  10/10/2010 DATE OF DISCHARGE:                              OPERATIVE REPORT   PREOPERATIVE DIAGNOSIS:  Umbilical hernia.  POSTOPERATIVE DIAGNOSIS:  Umbilical hernia.  OPERATION:  Repair of umbilical hernia with a small Ventralex patch.  ANESTHESIA:  General anesthesia, endotracheal tube.  HISTORY:  Jerry Campbell is a 60 year old Caucasian male referred to me by Dr. Eden Emms Baxley for a symptomatic umbilical hernia.  The patient says he has had a bulge at the navel for quite a few years, but it has recently gotten increased in size.  He is thin, kind of a exercise enthusiast, and the areas were certainly noticeable through his tee- shirt if he is straining it all.  The patient has had no symptoms of bowel obstruction or incarcerated-type symptoms and I plan on repairing this with a Ventralex patch kit.  Since I think that if we did not use the reinforcement that he will tend to get a recurrence because he has got kind of thin muscular layers.  PROCEDURE IN DETAIL:  The patient was taken to the operative suite. Induction of general anesthesia, endotracheal tube was positioned, and the anesthesiologist was Dr. Katrinka Blazing, and then the area around the navel had been clipped and that was prepped with Betadine surgical scrub and solution and draped in a sterile manner.  I made a little incision above the umbilicus.  Sharp dissection through the skin and subcutaneous tissue and then the hernia sac, that was plum-sized, was freed up circumferentially.  There was some incarcerated small-bowel pre and maybe a little bit of umbilicus up in the area.  This was all carefully separated and the little pedicles were tied with 2-0 Vicryl.  Next, the hernia sac  trimmings were freed up and then the hemostasis was carefully inspected in all areas to make sure there would be no postoperative bleeding.  I then used the small Ventralex patch, used a 0 Prolene superiorly and inferiorly since this a little bigger needle and anchored it a good inch away from the fascial edge, and then on the remaining sutures, I used 0 Surgilon.  The sutures were placed laterally, and then after all four sutures were placed, picking up a little bit of the patch kit in the basically U stitches and then tied all four.  It was necessary to put an additional little stitch up in the superior left side so there is good fascia pulled up nicely circumferentially.  I had trimmed off the little tails of the patch kit and then used 0 Surgilon closing the area transversely, probably about 5 stitches all total, in addition to the corner sutures.  The subcutaneous tissue was closed with 3-0 Vicryl.  I trimmed a little bit of the excess skin off and then closed the little skin defect with 4-0 nylon alternating simple and mattress sutures.  A little antibiotic ointment was placed and 4x4 was kind of rolled up,  placed in the umbilicus and then the Hypafix over the 4x4s.  The patient was then extubated.  He was straining quite vigorously but he had no stitches popping, etc., and then after the endotracheal tube was removed, he was comfortable and breathing nicely.  The patient will be released after a short stay in the recovery room.  His wife is a former ICU nurse and we will see him in the office at the end of next week for suture removal.     Anselm Pancoast. Zachery Dakins, M.D.     WJW/MEDQ  D:  10/10/2010  T:  10/10/2010  Job:  [id number]  cc:   Luanna Cole. Lenord Fellers, M.D. Fax: [phone]  Electronically Signed by Consuello Bossier M.D. on 10/18/2010 11:03:57 AM

## 2010-10-31 LAB — TROPONIN I: Troponin I: 0.03

## 2010-10-31 LAB — CK TOTAL AND CKMB (NOT AT ARMC)
CK, MB: 1.6
Relative Index: INVALID
Total CK: 55

## 2010-10-31 LAB — COMPREHENSIVE METABOLIC PANEL
AST: 20
Alkaline Phosphatase: 46
Calcium: 9.1
Chloride: 105
Creatinine, Ser: 1.04
Total Bilirubin: 1.4 — ABNORMAL HIGH
Total Protein: 6.7

## 2010-10-31 LAB — CBC
HCT: 44.8
MCV: 93.9
Platelets: 206
WBC: 8.6

## 2010-10-31 LAB — DIFFERENTIAL
Basophils Relative: 0
Eosinophils Absolute: 0 — ABNORMAL LOW
Lymphocytes Relative: 9 — ABNORMAL LOW
Lymphs Abs: 0.8
Monocytes Relative: 3
Neutro Abs: 7.5

## 2010-10-31 LAB — CARDIAC PANEL(CRET KIN+CKTOT+MB+TROPI)
Relative Index: INVALID
Total CK: 49
Troponin I: 0.02

## 2010-10-31 LAB — PROLACTIN: Prolactin: 8.7 (ref 2.1–17.1)

## 2010-11-01 ENCOUNTER — Other Ambulatory Visit (INDEPENDENT_AMBULATORY_CARE_PROVIDER_SITE_OTHER): Payer: Self-pay | Admitting: General Surgery

## 2010-11-02 ENCOUNTER — Ambulatory Visit (INDEPENDENT_AMBULATORY_CARE_PROVIDER_SITE_OTHER): Payer: 59 | Admitting: General Surgery

## 2010-11-02 ENCOUNTER — Encounter (INDEPENDENT_AMBULATORY_CARE_PROVIDER_SITE_OTHER): Payer: Self-pay | Admitting: General Surgery

## 2010-11-02 VITALS — BP 130/90 | HR 60 | Temp 97.9°F | Resp 20 | Ht 67.5 in | Wt 162.0 lb

## 2010-11-02 DIAGNOSIS — K429 Umbilical hernia without obstruction or gangrene: Secondary | ICD-10-CM

## 2010-11-02 NOTE — Progress Notes (Signed)
Subjective:     Patient ID: Jerry Campbell, male   DOB: 1951/01/18, 60 y.o.   MRN: [id number]  HPI3 week followup of an umbilical hernia repaired with a ventral X. patch. The patient states he's had very minimal pain his incision is healing nicely. I removed the sutures with his visit earlier and he understands to restrict his activity mildly for approximately 3 weeks.   Review of Systems     Objective:   Physical ExamBP 130/90  Pulse 60  Temp(Src) 97.9 F (36.6 C) (Temporal)  Resp 20  Ht 5' 7.5" (1.715 m)  Wt 162 lb (73.483 kg)  BMI 25.00 kg/m2    Patient's incision is healing nicely and he states he's had an essentially no pain. I asked him to not do any strenuous activity for one additional 3 weight and see [place] only if a problems occur Assessment:         Plan:     Prn follow up

## 2010-11-02 NOTE — Patient Instructions (Signed)
Limit heavy activities to about 35 pounds 3-4 weeks and no restrictions after that date. See me on a p.r.n. basis

## 2010-12-07 ENCOUNTER — Encounter: Payer: Self-pay | Admitting: Internal Medicine

## 2010-12-07 ENCOUNTER — Ambulatory Visit (INDEPENDENT_AMBULATORY_CARE_PROVIDER_SITE_OTHER): Payer: 59 | Admitting: Internal Medicine

## 2010-12-07 VITALS — BP 152/110 | HR 88 | Temp 97.8°F | Wt 162.5 lb

## 2010-12-07 DIAGNOSIS — M545 Low back pain, unspecified: Secondary | ICD-10-CM

## 2010-12-07 DIAGNOSIS — M5416 Radiculopathy, lumbar region: Secondary | ICD-10-CM

## 2010-12-07 DIAGNOSIS — M549 Dorsalgia, unspecified: Secondary | ICD-10-CM

## 2010-12-07 DIAGNOSIS — IMO0002 Reserved for concepts with insufficient information to code with codable children: Secondary | ICD-10-CM

## 2010-12-07 NOTE — Patient Instructions (Signed)
Take Flexeril at bedtime; take Sterapred DS 10 mg 12 day dosepak as directed; take Vicodin as needed for pain every 6 hours. MRI will be scheduled.

## 2010-12-07 NOTE — Progress Notes (Signed)
  Subjective:    Patient ID: Jerry Campbell, male    DOB: March 28, 1950, 60 y.o.   MRN: [id number]  HPI the patient has long-standing history of recurrent low back pain. From time to time, he would have minor flareups of back pain that were treated conservatively with Flexeril, prednisone and hydrocodone/APAP. He had an MRI of LS-spine by Dr. Darrelyn Hillock in 1999. At that time he had an L5-S1 left disc herniation that displaced the left S1 nerve root posteriorly and laterally. He had a small left-sided disc herniation within the left L4/L5 foramen which was thought to encourage upon the left L5 nerve root. Had annular disc bulge at L3-L4 without nerve root compression. There was desiccation of the last 3 lumbar disc.  Patient is considering retiring. He began to have some back pain once again the latter part of October. He comes in today walking with a cane and HTN. He can barely move about. Says pain has gotten worse over the past week. He's been taking Flexeril. He has some hydrocodone on hand but has been reluctant take much of that. His back pain is always in the left paralumbar region and radiates down his left leg. He has that once again.    Review of Systems     Objective:   Physical Exam straight leg raising is positive on the left at 90, negative on the right. Deep tendon reflexes are 2+ and symmetrical in the knees. Quadriceps strength is 4/5 on the left 5 over 5 on the right. Able to dorsiflex both feet. Moves very stiffly and slowly.          Assessment & Plan:  Left lumbar back pain with radiculopathy left leg  History of chronic recurrent low back pain with history of disc L5-S1  Plan: Flexeril 10 mg by mouth each bedtime, Vicodin 5/500 (#60) 1 by mouth every 6 hours when necessary pain no refill. Sterapred DS 10 mg 12 day dosepak take hysterectomy. Patient is considering retirement. He wants to have his back situation evaluated prior to retirement. He is asking for an MRI of  LS-spine. Will try to arrange.

## 2010-12-08 ENCOUNTER — Ambulatory Visit
Admission: RE | Admit: 2010-12-08 | Discharge: 2010-12-08 | Disposition: A | Discharge status: Home / Self Care | Payer: 59 | Source: Ambulatory Visit | Attending: Internal Medicine | Admitting: Internal Medicine

## 2010-12-08 DIAGNOSIS — M549 Dorsalgia, unspecified: Secondary | ICD-10-CM

## 2010-12-11 ENCOUNTER — Other Ambulatory Visit: Payer: Self-pay | Admitting: Internal Medicine

## 2010-12-12 ENCOUNTER — Telehealth: Payer: Self-pay | Admitting: Internal Medicine

## 2010-12-12 ENCOUNTER — Other Ambulatory Visit: Payer: 59

## 2010-12-12 NOTE — Telephone Encounter (Signed)
No significant change in back. Still has problem at L5-S1. Seems about the same to me. If not better, return to neurosugeon. Results were mailed out today.

## 2011-01-01 ENCOUNTER — Encounter: Payer: Self-pay | Admitting: Internal Medicine

## 2011-01-01 ENCOUNTER — Ambulatory Visit (INDEPENDENT_AMBULATORY_CARE_PROVIDER_SITE_OTHER): Payer: 59 | Admitting: Internal Medicine

## 2011-01-01 VITALS — BP 126/90 | HR 96 | Temp 99.5°F | Wt 162.0 lb

## 2011-01-01 DIAGNOSIS — J069 Acute upper respiratory infection, unspecified: Secondary | ICD-10-CM

## 2011-01-01 DIAGNOSIS — H6690 Otitis media, unspecified, unspecified ear: Secondary | ICD-10-CM

## 2011-01-01 DIAGNOSIS — IMO0002 Reserved for concepts with insufficient information to code with codable children: Secondary | ICD-10-CM

## 2011-01-01 DIAGNOSIS — M5416 Radiculopathy, lumbar region: Secondary | ICD-10-CM

## 2011-01-01 DIAGNOSIS — H6692 Otitis media, unspecified, left ear: Secondary | ICD-10-CM

## 2011-01-01 NOTE — Patient Instructions (Signed)
Take Augmentin 500 mg 3 times daily for ear infection. Purchase Sudafed from pharmacy to take for decongestant. You have been given prescription for physical therapy. Please make-year-old appointment. I understand you're to see neurosurgeon January 16

## 2011-01-01 NOTE — Progress Notes (Signed)
  Subjective:    Patient ID: Jerry Campbell, male    DOB: August 14, 1950, 60 y.o.   MRN: [id number]  HPI patient in today complaining of left ear pain. Has had URI symptoms for couple of days. Left ear is now stopped up and he can't hear well. Also still has significant issue with left lumbar radiculopathy. Has appointment to see Dr. Danielle Dess January 16. He wants to get an opinion as soon as possible regarding his back because he is considering retirement. No fever or shaking chills. No myalgias. Has had flu vaccine.    Review of Systems     Objective:   Physical Exam straight leg raising is positive today on the left at 90. He sits slowly and rises from sitting slowly. Continues to use cane some for ambulation. HEENT exam: Left TM is clear; right TM is red. Pharynx is slightly injected. Neck is supple. Chest is clear.        Assessment & Plan:  Right otitis media  URI  Left lumbar radiculopathy  Plan: Augmentin 500 mg 3 times daily for 10 days. Purchased Sudafed over-the-counter for decongestant. Note given for him to start physical therapy as soon as possible. Keep appointment with neurosurgeon mid January.

## 2011-02-09 ENCOUNTER — Encounter: Payer: Self-pay | Admitting: Internal Medicine

## 2011-03-02 ENCOUNTER — Telehealth: Payer: Self-pay | Admitting: Internal Medicine

## 2011-03-02 MED ORDER — PREDNISONE (PAK) 10 MG PO TABS: 10.0000 mg | ORAL_TABLET | Freq: Every day | ORAL | Status: AC

## 2011-03-02 NOTE — Telephone Encounter (Signed)
Per verbal order of Dr. Lenord Fellers, Prednisone dosepak 10mg , called into [facility]. Appointment here next week.

## 2011-03-06 ENCOUNTER — Ambulatory Visit (INDEPENDENT_AMBULATORY_CARE_PROVIDER_SITE_OTHER): Payer: 59 | Admitting: Internal Medicine

## 2011-03-06 VITALS — BP 148/100 | HR 88 | Temp 98.5°F | Wt 162.0 lb

## 2011-03-06 DIAGNOSIS — F419 Anxiety disorder, unspecified: Secondary | ICD-10-CM

## 2011-03-06 DIAGNOSIS — M545 Low back pain, unspecified: Secondary | ICD-10-CM

## 2011-03-06 DIAGNOSIS — F411 Generalized anxiety disorder: Secondary | ICD-10-CM

## 2011-03-06 DIAGNOSIS — Z0000 Encounter for general adult medical examination without abnormal findings: Secondary | ICD-10-CM

## 2011-03-14 ENCOUNTER — Encounter: Payer: Self-pay | Admitting: Internal Medicine

## 2011-03-14 NOTE — Patient Instructions (Signed)
Take Valium 5 mg 3 times daily for anxiety and back spasm. Start Medrol Dosepak for 6 days and have prescription refill that the end of an initial course. We will arrange for you to see a neurosurgeon regarding possible epidural steroid injections. Try to cut back on hours that she's been taking care of your parents for nail.

## 2011-03-14 NOTE — Progress Notes (Signed)
  Subjective:    Patient ID: Jerry Campbell, male    DOB: 05/13/1950, 61 y.o.   MRN: [id number]  HPI 61 year old white male with history of back pain now in today with acute back pain. He's been spending time over at his parent's home trying to take care of elderly parents sleeping on the couch and not getting much rest. He is very anxious about his back pain today. He cannot lie still. He is up and down on the exam table in acute distress. He is accompanied by his wife who has multiple sclerosis. We recently called in for him a Sterapred DS 10 mg 6 day dosepak but he says he has not gotten much relief. He recently had an MRI of LS-spine. He recently retired from his job. He did not seems that the MRI looked much different than his previous MRI a few years ago. Feels that he has to be better in order to spend time with his parents. He and his brother or alternating time over there. Does not have radiculopathy.    Review of Systems     Objective:   Physical Exam muscle strength is 5 over 5 in the lower extremities. Deep tendon reflexes in the knees 1+ and symmetrical, absent in the ankles. Seems to have a fair amount of spasm in his back. Seems to have tight hamstrings. Extremely anxious today.        Assessment & Plan:  Low back pain  Anxiety  Plan: Gave patient prescription for Valium 5 mg 3 times a day. Try Medrol 4 mg Dosepak for for 6 days and get prescription refilled and take an additional 6 days. Appointment with neurosurgeon to consider epidural steroid injections. Stop Flexeril. Use Valium instead. Prescription for narcotic pain medication.

## 2011-04-06 ENCOUNTER — Encounter (HOSPITAL_COMMUNITY): Payer: Self-pay

## 2011-04-06 ENCOUNTER — Other Ambulatory Visit: Payer: Self-pay

## 2011-04-06 ENCOUNTER — Encounter (HOSPITAL_COMMUNITY): Payer: Self-pay | Admitting: Pharmacy Technician

## 2011-04-06 ENCOUNTER — Other Ambulatory Visit: Payer: Self-pay | Admitting: Neurological Surgery

## 2011-04-06 ENCOUNTER — Encounter (HOSPITAL_COMMUNITY)
Admission: RE | Admit: 2011-04-06 | Discharge: 2011-04-06 | Disposition: A | Discharge status: Home / Self Care | Payer: 59 | Source: Ambulatory Visit | Attending: Neurological Surgery | Admitting: Neurological Surgery

## 2011-04-06 HISTORY — DX: Personal history of urinary calculi: Z87.442

## 2011-04-06 HISTORY — DX: Dorsalgia, unspecified: M54.9

## 2011-04-06 HISTORY — DX: Anxiety disorder, unspecified: F41.9

## 2011-04-06 HISTORY — DX: Bronchitis, not specified as acute or chronic: J40

## 2011-04-06 HISTORY — DX: Other chronic pain: G89.29

## 2011-04-06 HISTORY — DX: Disorientation, unspecified: R41.0

## 2011-04-06 LAB — SURGICAL PCR SCREEN: Staphylococcus aureus: NEGATIVE

## 2011-04-06 LAB — CBC
HCT: 45 % (ref 39.0–52.0)
Hemoglobin: 15.2 g/dL (ref 13.0–17.0)
MCHC: 33.8 g/dL (ref 30.0–36.0)
WBC: 5 10*3/uL (ref 4.0–10.5)

## 2011-04-06 LAB — BASIC METABOLIC PANEL
BUN: 18 mg/dL (ref 6–23)
Chloride: 106 mEq/L (ref 96–112)
GFR calc Af Amer: >90 mL/min (ref >90)
Potassium: 4.2 mEq/L (ref 3.5–5.1)

## 2011-04-06 NOTE — Pre-Procedure Instructions (Signed)
22 Jerry Campbell  04/06/2011   Your procedure is scheduled on:  Mon, Mar 18 @ 1:00PM  Report to [facility] at 1000 AM.  Call this number if you have problems the morning of surgery: [phone]   Remember:   Do not eat food:After Midnight.  May have clear liquids: up to 4 Hours before arrival.(until 6:00 am)  Clear liquids include soda, tea, black coffee, apple or grape juice, broth,water  Take these medicines the morning of surgery with A SIP OF WATER: Pain Pill( if needed)   Do not wear jewelry, make-up or nail polish.  Do not wear lotions, powders, or perfumes.  Do not bring valuables to the hospital.  Contacts, dentures or bridgework may not be worn into surgery.  Leave suitcase in the car. After surgery it may be brought to your room.  For patients admitted to the hospital, checkout time is 11:00 AM the day of discharge.   Patients discharged the day of surgery will not be allowed to drive home.   Special Instructions: CHG Shower Use Special Wash: 1/2 bottle night before surgery and 1/2 bottle morning of surgery.   Please read over the following fact sheets that you were given: Pain Booklet, Coughing and Deep Breathing, MRSA Information and Surgical Site Infection Prevention

## 2011-04-06 NOTE — Progress Notes (Addendum)
Dr.Hank Cone-saw several yrs ago but doesn't have to see  Medical MD Sharlet Salina at [facility] hyperlipidemia  Stress test done around 2008 Echo done in 2008 Denies ever having a heart cath

## 2011-04-08 MED ORDER — CEFAZOLIN SODIUM 1-5 GM-% IV SOLN
1.0000 g | INTRAVENOUS | Status: AC
  Administered 2011-04-09: 1 g via INTRAVENOUS
  Filled 2011-04-08: qty 50

## 2011-04-09 ENCOUNTER — Encounter (HOSPITAL_COMMUNITY)
Admission: RE | Disposition: A | Discharge status: Home / Self Care | Payer: Self-pay | Source: Ambulatory Visit | Attending: Neurological Surgery

## 2011-04-09 ENCOUNTER — Encounter (HOSPITAL_COMMUNITY): Payer: Self-pay | Admitting: Surgery

## 2011-04-09 ENCOUNTER — Encounter (HOSPITAL_COMMUNITY): Payer: Self-pay | Admitting: Anesthesiology

## 2011-04-09 ENCOUNTER — Ambulatory Visit (HOSPITAL_COMMUNITY): Payer: 59 | Admitting: Anesthesiology

## 2011-04-09 ENCOUNTER — Ambulatory Visit (HOSPITAL_COMMUNITY)
Admission: RE | Admit: 2011-04-09 | Discharge: 2011-04-10 | Disposition: A | Discharge status: Home / Self Care | Payer: 59 | Source: Ambulatory Visit | Attending: Neurological Surgery | Admitting: Neurological Surgery

## 2011-04-09 ENCOUNTER — Ambulatory Visit (HOSPITAL_COMMUNITY): Payer: 59

## 2011-04-09 DIAGNOSIS — M5127 Other intervertebral disc displacement, lumbosacral region: Secondary | ICD-10-CM

## 2011-04-09 DIAGNOSIS — Z01810 Encounter for preprocedural cardiovascular examination: Secondary | ICD-10-CM | POA: Insufficient documentation

## 2011-04-09 DIAGNOSIS — M5126 Other intervertebral disc displacement, lumbar region: Secondary | ICD-10-CM | POA: Insufficient documentation

## 2011-04-09 DIAGNOSIS — Z01812 Encounter for preprocedural laboratory examination: Secondary | ICD-10-CM | POA: Insufficient documentation

## 2011-04-09 HISTORY — PX: LUMBAR LAMINECTOMY/DECOMPRESSION MICRODISCECTOMY: SHX5026

## 2011-04-09 SURGERY — LUMBAR LAMINECTOMY/DECOMPRESSION MICRODISCECTOMY
Anesthesia: General | Site: Back | Laterality: Left | Wound class: Clean

## 2011-04-09 MED ORDER — MENTHOL 3 MG MT LOZG: 1.0000 | LOZENGE | OROMUCOSAL | Status: DC | PRN

## 2011-04-09 MED ORDER — ONDANSETRON HCL 4 MG/2ML IJ SOLN
INTRAMUSCULAR | Status: DC | PRN
  Administered 2011-04-09: 4 mg via INTRAVENOUS

## 2011-04-09 MED ORDER — KETOROLAC TROMETHAMINE 30 MG/ML IJ SOLN
INTRAMUSCULAR | Status: AC
  Administered 2011-04-09: 30 mg
  Filled 2011-04-09: qty 1

## 2011-04-09 MED ORDER — PROPOFOL 10 MG/ML IV EMUL
INTRAVENOUS | Status: DC | PRN
  Administered 2011-04-09: 150 mg via INTRAVENOUS

## 2011-04-09 MED ORDER — MORPHINE SULFATE 4 MG/ML IJ SOLN: 1.0000 mg | INTRAMUSCULAR | Status: DC | PRN

## 2011-04-09 MED ORDER — DIAZEPAM 5 MG PO TABS
5.0000 mg | ORAL_TABLET | Freq: Four times a day (QID) | ORAL | Status: DC | PRN
  Administered 2011-04-09 – 2011-04-10 (×2): 5 mg via ORAL
  Filled 2011-04-09 (×2): qty 1

## 2011-04-09 MED ORDER — ACETAMINOPHEN 650 MG RE SUPP: 650.0000 mg | RECTAL | Status: DC | PRN

## 2011-04-09 MED ORDER — FENTANYL CITRATE 0.05 MG/ML IJ SOLN
INTRAMUSCULAR | Status: DC | PRN
  Administered 2011-04-09: 50 ug via INTRAVENOUS
  Administered 2011-04-09 (×3): 25 ug via INTRAVENOUS
  Administered 2011-04-09: 100 ug via INTRAVENOUS
  Administered 2011-04-09: 25 ug via INTRAVENOUS

## 2011-04-09 MED ORDER — SIMVASTATIN 10 MG PO TABS
10.0000 mg | ORAL_TABLET | Freq: Every day | ORAL | Status: DC
  Administered 2011-04-09: 10 mg via ORAL
  Filled 2011-04-09 (×2): qty 1

## 2011-04-09 MED ORDER — NEOSTIGMINE METHYLSULFATE 1 MG/ML IJ SOLN
INTRAMUSCULAR | Status: DC | PRN
  Administered 2011-04-09: 4 mg via INTRAVENOUS

## 2011-04-09 MED ORDER — DEXTROSE 5 % IV SOLN
INTRAVENOUS | Status: DC | PRN
  Administered 2011-04-09: 14:00:00 via INTRAVENOUS

## 2011-04-09 MED ORDER — SODIUM CHLORIDE 0.9 % IV SOLN
INTRAVENOUS | Status: AC
  Filled 2011-04-09: qty 500

## 2011-04-09 MED ORDER — HEMOSTATIC AGENTS (NO CHARGE) OPTIME
TOPICAL | Status: DC | PRN
  Administered 2011-04-09: 1 via TOPICAL

## 2011-04-09 MED ORDER — SODIUM CHLORIDE 0.9 % IV SOLN: 250.0000 mL | INTRAVENOUS | Status: DC

## 2011-04-09 MED ORDER — SODIUM CHLORIDE 0.9 % IJ SOLN: 3.0000 mL | INTRAMUSCULAR | Status: DC | PRN

## 2011-04-09 MED ORDER — MORPHINE SULFATE 2 MG/ML IJ SOLN: 0.0500 mg/kg | INTRAMUSCULAR | Status: DC | PRN

## 2011-04-09 MED ORDER — LIDOCAINE-EPINEPHRINE 1 %-1:100000 IJ SOLN
INTRAMUSCULAR | Status: DC | PRN
  Administered 2011-04-09: 20 mL

## 2011-04-09 MED ORDER — PHENOL 1.4 % MT LIQD: 1.0000 | OROMUCOSAL | Status: DC | PRN

## 2011-04-09 MED ORDER — ONDANSETRON HCL 4 MG/2ML IJ SOLN: 4.0000 mg | INTRAMUSCULAR | Status: DC | PRN

## 2011-04-09 MED ORDER — KETOROLAC TROMETHAMINE 30 MG/ML IJ SOLN
30.0000 mg | Freq: Three times a day (TID) | INTRAMUSCULAR | Status: DC
  Administered 2011-04-10 (×2): 30 mg via INTRAVENOUS
  Filled 2011-04-09 (×5): qty 1

## 2011-04-09 MED ORDER — GLYCOPYRROLATE 0.2 MG/ML IJ SOLN
INTRAMUSCULAR | Status: DC | PRN
  Administered 2011-04-09: 0.6 mg via INTRAVENOUS

## 2011-04-09 MED ORDER — SODIUM CHLORIDE 0.9 % IR SOLN
Status: DC | PRN
  Administered 2011-04-09: 15:00:00

## 2011-04-09 MED ORDER — SODIUM CHLORIDE 0.9 % IJ SOLN
3.0000 mL | Freq: Two times a day (BID) | INTRAMUSCULAR | Status: DC
  Administered 2011-04-09 – 2011-04-10 (×2): 3 mL via INTRAVENOUS

## 2011-04-09 MED ORDER — BUPIVACAINE HCL (PF) 0.5 % IJ SOLN
INTRAMUSCULAR | Status: DC | PRN
  Administered 2011-04-09: 30 mL

## 2011-04-09 MED ORDER — THROMBIN 5000 UNITS EX SOLR
CUTANEOUS | Status: DC | PRN
  Administered 2011-04-09 (×2): 5000 [IU] via TOPICAL

## 2011-04-09 MED ORDER — ACETAMINOPHEN 325 MG PO TABS: 650.0000 mg | ORAL_TABLET | ORAL | Status: DC | PRN

## 2011-04-09 MED ORDER — MIDAZOLAM HCL 5 MG/5ML IJ SOLN
INTRAMUSCULAR | Status: DC | PRN
  Administered 2011-04-09: 2 mg via INTRAVENOUS

## 2011-04-09 MED ORDER — ALUM & MAG HYDROXIDE-SIMETH 200-200-20 MG/5ML PO SUSP: 30.0000 mL | Freq: Four times a day (QID) | ORAL | Status: DC | PRN

## 2011-04-09 MED ORDER — HYDROMORPHONE HCL PF 1 MG/ML IJ SOLN
INTRAMUSCULAR | Status: AC
  Administered 2011-04-09: 0.5 mg
  Filled 2011-04-09: qty 1

## 2011-04-09 MED ORDER — BACITRACIN 50000 UNITS IM SOLR
INTRAMUSCULAR | Status: AC
  Filled 2011-04-09: qty 1

## 2011-04-09 MED ORDER — LACTATED RINGERS IV SOLN
INTRAVENOUS | Status: DC | PRN
  Administered 2011-04-09 (×2): via INTRAVENOUS

## 2011-04-09 MED ORDER — HYDROMORPHONE HCL PF 1 MG/ML IJ SOLN: 0.2500 mg | INTRAMUSCULAR | Status: DC | PRN

## 2011-04-09 MED ORDER — ONDANSETRON HCL 4 MG/2ML IJ SOLN: 4.0000 mg | Freq: Once | INTRAMUSCULAR | Status: DC | PRN

## 2011-04-09 MED ORDER — EPHEDRINE SULFATE 50 MG/ML IJ SOLN
INTRAMUSCULAR | Status: DC | PRN
  Administered 2011-04-09 (×2): 5 mg via INTRAVENOUS

## 2011-04-09 MED ORDER — OXYCODONE-ACETAMINOPHEN 5-325 MG PO TABS
1.0000 | ORAL_TABLET | ORAL | Status: DC | PRN
  Administered 2011-04-09 – 2011-04-10 (×2): 1 via ORAL
  Filled 2011-04-09 (×2): qty 1

## 2011-04-09 MED ORDER — SENNA 8.6 MG PO TABS
1.0000 | ORAL_TABLET | Freq: Every day | ORAL | Status: DC | PRN
  Administered 2011-04-09: 8.6 mg via ORAL
  Filled 2011-04-09: qty 1

## 2011-04-09 MED ORDER — LIDOCAINE HCL (CARDIAC) 20 MG/ML IV SOLN
INTRAVENOUS | Status: DC | PRN
  Administered 2011-04-09: 100 mg via INTRAVENOUS

## 2011-04-09 MED ORDER — ROCURONIUM BROMIDE 100 MG/10ML IV SOLN
INTRAVENOUS | Status: DC | PRN
Start: 2011-04-09 — End: 2011-04-09
  Administered 2011-04-09: 50 mg via INTRAVENOUS

## 2011-04-09 SURGICAL SUPPLY — 50 items
BAG DECANTER FOR FLEXI CONT (MISCELLANEOUS) ×2 IMPLANT
BLADE SURG ROTATE 9660 (MISCELLANEOUS) IMPLANT
BUR ACORN 6.0 (BURR) IMPLANT
BUR MATCHSTICK NEURO 3.0 LAGG (BURR) ×2 IMPLANT
CANISTER SUCTION 2500CC (MISCELLANEOUS) ×2 IMPLANT
CLOTH BEACON ORANGE TIMEOUT ST (SAFETY) ×2 IMPLANT
CONT SPEC 4OZ CLIKSEAL STRL BL (MISCELLANEOUS) ×2 IMPLANT
DECANTER SPIKE VIAL GLASS SM (MISCELLANEOUS) ×2 IMPLANT
DERMABOND ADVANCED (GAUZE/BANDAGES/DRESSINGS) ×1
DERMABOND ADVANCED .7 DNX12 (GAUZE/BANDAGES/DRESSINGS) ×1 IMPLANT
DRAPE LAPAROTOMY 100X72X124 (DRAPES) ×2 IMPLANT
DRAPE MICROSCOPE LEICA (MISCELLANEOUS) IMPLANT
DRAPE POUCH INSTRU U-SHP 10X18 (DRAPES) ×2 IMPLANT
DRAPE PROXIMA HALF (DRAPES) IMPLANT
DURAPREP 26ML APPLICATOR (WOUND CARE) ×2 IMPLANT
ELECT REM PT RETURN 9FT ADLT (ELECTROSURGICAL) ×2
ELECTRODE REM PT RTRN 9FT ADLT (ELECTROSURGICAL) ×1 IMPLANT
GAUZE SPONGE 4X4 16PLY XRAY LF (GAUZE/BANDAGES/DRESSINGS) IMPLANT
GLOVE BIOGEL PI IND STRL 6.5 (GLOVE) ×3 IMPLANT
GLOVE BIOGEL PI IND STRL 8.5 (GLOVE) ×1 IMPLANT
GLOVE BIOGEL PI INDICATOR 6.5 (GLOVE) ×3
GLOVE BIOGEL PI INDICATOR 8.5 (GLOVE) ×1
GLOVE ECLIPSE 8.5 STRL (GLOVE) ×4 IMPLANT
GLOVE EXAM NITRILE LRG STRL (GLOVE) IMPLANT
GLOVE EXAM NITRILE MD LF STRL (GLOVE) IMPLANT
GLOVE EXAM NITRILE XL STR (GLOVE) IMPLANT
GLOVE EXAM NITRILE XS STR PU (GLOVE) IMPLANT
GLOVE INDICATOR 7.0 STRL GRN (GLOVE) ×2 IMPLANT
GOWN BRE IMP SLV AUR LG STRL (GOWN DISPOSABLE) ×2 IMPLANT
GOWN BRE IMP SLV AUR XL STRL (GOWN DISPOSABLE) ×2 IMPLANT
GOWN STRL REIN 2XL LVL4 (GOWN DISPOSABLE) ×2 IMPLANT
KIT BASIN OR (CUSTOM PROCEDURE TRAY) ×2 IMPLANT
KIT ROOM TURNOVER OR (KITS) ×2 IMPLANT
NEEDLE HYPO 22GX1.5 SAFETY (NEEDLE) ×2 IMPLANT
NEEDLE SPNL 20GX3.5 QUINCKE YW (NEEDLE) IMPLANT
NS IRRIG 1000ML POUR BTL (IV SOLUTION) ×2 IMPLANT
PACK LAMINECTOMY NEURO (CUSTOM PROCEDURE TRAY) ×2 IMPLANT
PAD ARMBOARD 7.5X6 YLW CONV (MISCELLANEOUS) ×6 IMPLANT
PATTIES SURGICAL .5 X1 (DISPOSABLE) ×2 IMPLANT
RUBBERBAND STERILE (MISCELLANEOUS) IMPLANT
SPONGE GAUZE 4X4 12PLY (GAUZE/BANDAGES/DRESSINGS) ×2 IMPLANT
SPONGE SURGIFOAM ABS GEL SZ50 (HEMOSTASIS) ×2 IMPLANT
SUT VIC AB 1 CT1 18XBRD ANBCTR (SUTURE) ×1 IMPLANT
SUT VIC AB 1 CT1 8-18 (SUTURE) ×1
SUT VIC AB 2-0 CP2 18 (SUTURE) ×2 IMPLANT
SUT VIC AB 3-0 SH 8-18 (SUTURE) ×2 IMPLANT
SYR 20ML ECCENTRIC (SYRINGE) ×2 IMPLANT
TOWEL OR 17X24 6PK STRL BLUE (TOWEL DISPOSABLE) ×2 IMPLANT
TOWEL OR 17X26 10 PK STRL BLUE (TOWEL DISPOSABLE) ×2 IMPLANT
WATER STERILE IRR 1000ML POUR (IV SOLUTION) ×2 IMPLANT

## 2011-04-09 NOTE — Anesthesia Preprocedure Evaluation (Addendum)
Anesthesia Evaluation  Patient identified by MRN, date of birth, ID band Patient awake    Reviewed: Allergy & Precautions, H&P , NPO status , Patient's Chart, lab work & pertinent test results  Airway Mallampati: II TM Distance: >3 FB Neck ROM: Full    Dental  (+) Teeth Intact and Dental Advisory Given,    Pulmonary neg pulmonary ROS,  breath sounds clear to auscultation  Pulmonary exam normal       Cardiovascular negative cardio ROS  Rhythm:Regular Rate:Normal     Neuro/Psych    GI/Hepatic negative GI ROS,   Endo/Other  negative endocrine ROS  Renal/GU negative Renal ROS     Musculoskeletal negative musculoskeletal ROS (+)   Abdominal Normal abdominal exam  (+)   Peds  Hematology negative hematology ROS (+)   Anesthesia Other Findings   Reproductive/Obstetrics                         Anesthesia Physical Anesthesia Plan  ASA: II  Anesthesia Plan: General   Post-op Pain Management:    Induction: Intravenous  Airway Management Planned: Oral ETT  Additional Equipment:   Intra-op Plan:   Post-operative Plan: Extubation in OR  Informed Consent: I have reviewed the patients History and Physical, chart, labs and discussed the procedure including the risks, benefits and alternatives for the proposed anesthesia with the patient or authorized representative who has indicated his/her understanding and acceptance.   Dental advisory given  Plan Discussed with: CRNA, Anesthesiologist and Surgeon  Anesthesia Plan Comments:        Anesthesia Quick Evaluation

## 2011-04-09 NOTE — Anesthesia Postprocedure Evaluation (Signed)
  Anesthesia Post-op Note  Patient: Jerry Campbell  Procedure(s) Performed: Procedure(s) (LRB): LUMBAR LAMINECTOMY/DECOMPRESSION MICRODISCECTOMY (Left)  Patient Location: PACU  Anesthesia Type: General  Level of Consciousness: awake, alert  and oriented  Airway and Oxygen Therapy: Patient Spontanous Breathing and Patient connected to nasal cannula oxygen  Post-op Pain: mild  Post-op Assessment: Post-op Vital signs reviewed, Patient's Cardiovascular Status Stable, Respiratory Function Stable, Patent Airway, No signs of Nausea or vomiting and Pain level controlled  Post-op Vital Signs: Reviewed and stable  Complications: No apparent anesthesia complications

## 2011-04-09 NOTE — H&P (Signed)
04/09/2011 No changes in H&P since January 2013. Barbara Cower. [place] #[id number] DOB:  11-Oct-1950 04/04/2011:    Ry returns to the office today.  He has had a new MRI of his lumbar spine.  The findings are that he now has the disc herniation at L5-S1 larger than it was previously compressing the left S1 nerve root against the laminar arch.  I demonstrated the findings to him and his wife and indicated that this process clearly now is surgical.  The right sided nerve root exits freely.  I demonstrated this compared to the left side where he has significant compression by the disc.  I noted that on his previous study several months ago though he had a central disc herniation the nerve root exited freely and indeed he recovered well.  My suspicion was that he had a problem a little bit higher up at L3-4 is unfounded.  He does have some broad-based disc degeneration at L4-5 but the most acute problem is at L5-S1 and has significant left S1 nerve root compromise.  I discussed that he should have a microdiskectomy to decompress the S1 nerve root on the left side.  We will schedule this at the earliest possible convenience.  I would expect that this can be done either as an outpatient or possibly with a simple overnight stay if necessary.         Paralee Pendergrass,MD NEUROSURGICAL CONSULTATION  Barbara Cower. [place]  #[id number]  DOB:  [birth year]  February 07, 2011  CHIEF COMPLAINT:    61 Back and left lower extremity pain.   HISTORY OF PRESENT ILLNESS:  Mr. Reyan Helle returns to the office today.  I visited with him a couple of times back in 2000.  He tells me that he has had life long problems with some element of back pain ever since he was a young man.  When I visited with him back in 2000, he had an episode of prolonged back pain with some radicular pain in the left lower extremity. At that time, I noted that he had some very mild spondylitic changes in the lower lumbar spine and I suggested only conservative management of this  process.  He tells me that since mid to late October he has had another issue with a flare up of significant back pain and left lower extremity pain.  He was seen by his primary care physician, Dr. Eden Emms Baxley.  He was given an oral course of Prednisone. He has been participating in chiropractor care by Nicki Reaper for the past number of years and more recently he was prescribed some physical therapy by Dr. Eden Emms Baxley.  [facility] done [facility] and he has been started on a routine program of some exercises for his low back.  He notes that since starting the exercise program the back pain has lessened dramatically and he still has some awareness of radicular discomfort in the left side but much of the acute and severe pain has essentially gone away.  Because of the concerns of his back changing, an MRI of the lumbar spine was performed in November.  I had the opportunity to review this study with Mr. Ravenscroft and I note that he has evidence of some degenerative changes at the 3 discs in the lower lumbar spine L3-4, 4-5 and 5-1.  L5-S1 is most significantly degenerated with left lateral subligamentous disc herniation that elevates the take off of the S1 nerve root as it enters the foramen.  I demonstrated the findings to him noting the difference from left to right side which explains nicely his left-sided radicular pain.  I indicated that this process does appear to have been there for some time and does appear to be chronic and if the inflammation that aggravates pain in the leg can be controlled this should be a process that should be tolerated well without the need for any surgical intervention.  I indicated that a program of core strengthening exercises for the lumbar spine such as the McKenzie program would be beneficial.  I also suggested that a trial of some Omega-3 supplementation may be beneficial to help control inflammation particularly as it relates to his radiculopathy.  Beyond that, I  note that he is on a statin agent which can on occasion aggravate radicular pain.    Nonetheless, at this point I would shy away from considering any surgical intervention for this process.  I did note that if radicular pain should recur or become more of a persistent problem, we could consider transforaminal epidural steroid injection on the left side at L5-S1 to help ameliorate irritability in that nerve.  Only if all these things were to fail, would I suggest surgical intervention for this process.    I note his Past Medical History otherwise notes his general health has been quite good.  He had an episode of transient global amnesia noted by Dr. Avie Echevaria in the past.  FAMILY HISTORY:    Mother is age 59, father is age 59  The health status of his mom reveals that she has high blood pressure, glaucoma, high cholesterol, and breast cancer.  Father also has a history of some high blood pressure, coronary artery disease, and has a history of prostate cancer and diabetes.    SOCIAL HISTORY:    The patient does not smoke. He drinks alcohol on a social basis.  Height and weight have been stable at 5'7" and 160 pounds.    REVIEW OF SYSTEMS:   Notable for wearing glasses, difficulty with starting and stopping urinary stream, back pain, leg pain on a 14-point review sheet.    CURRENT MEDICATIONS:   Simvastatin, Flexeril, Vicodin, multivitamin, aspirin, D3 supplementation, glucosamine chondroitin and melatonin.  He notes no allergies to any medications.    PHYSICAL EXAMINATION:   His physical exam reveals that he stands straight and erect without difficulty.  His motor function appears intact in the major groups including the tibialis anterior and gastrocs bilaterally.  No more formal mobility exam was performed on today's visit.   IMPRESSION and PLAN:   I discussed with Mr. Sedor the anatomy in his lumbar spine as it pertains to his left lumbar radiculopathy and back pain.  I believe that a conservative  course of action would be the best intervention for him as outlined above.  Should there be an exacerbation of significant pain, we could consider a transforaminal epidural steroid injection but I believe his course of action as suggested by Dr. Sharlet Salina has been very correct and appropriate.  I have encouraged him to continue to pursue a program of physical fitness and back exercises beyond the physical therapy to do on a regular basis.  I have suggested the addition of some Omega-3 fatty acid supplementation and I am hopeful that this will allow good control of his radicular pain.  I will remain available to see him should he have a flare up.   [facility]  Stefani Dama, M.D.

## 2011-04-09 NOTE — Transfer of Care (Signed)
Immediate Anesthesia Transfer of Care Note  Patient: Jerry Campbell  Procedure(s) Performed: Procedure(s) (LRB): LUMBAR LAMINECTOMY/DECOMPRESSION MICRODISCECTOMY (Left)  Patient Location: PACU  Anesthesia Type: General  Level of Consciousness: awake, alert  and oriented  Airway & Oxygen Therapy: Patient Spontanous Breathing and Patient connected to nasal cannula oxygen  Post-op Assessment: Report given to PACU RN, Post -op Vital signs reviewed and stable and Patient moving all extremities X 4  Post vital signs: Reviewed and stable  Complications: No apparent anesthesia complications

## 2011-04-09 NOTE — Op Note (Signed)
Preoperative diagnosis: Herniated nucleus pulposus L5-S1 with left lumbar radiculopathy Postoperative diagnosis: Herniated nucleus pulposus L5-S1 with left lumbar radiculopathy Procedure: Microdiscectomy L5-S1 left with operating microscope microdissection technique Surgeon: Barnett Abu M.D. Assistant: Lelon Perla M.D. Indications: Patient is a 61 year old male who has had several bouts of back pain and radiculopathy in the past this time he had severe left lumbar radiculopathy involving the S1 nerve root causing him substantial pain in the left leg left buttock and weakness in the left calf is advised regarding the need for surgical decompression.  Procedure: The patient was brought to the operating room supine on a stretcher. After the smooth induction of general endotracheal anesthesia patient was carefully turned to the prone position with the bony prominences being appropriately padded and protected. The lower lumbar spine was prepped with alcohol and DuraPrep and then draped in a sterile fashion. The needle was used to localize the area of L5-S1 and a radiograph was obtained demonstrating a needle at the level of L5-S1. Skin was infiltrated with 10 cc of lidocaine 1-100,000 epinephrine mixed 50-50 with half percent Marcaine. An incision was made and carried down to the lumbar dorsal fascia the fascia was opened on the left side of the midline and a subperiosteal dissection was performed in the interlaminar space of L5-S1. A self-retaining retractor was then placed into the wound. The yellow ligament was taken up from the interlaminar space at L5-S1 and the inferior margin of the lamina of L5 was removed and a partial facetectomy was performed at L5-S1 on the left. The common dural tube was explored and the take off of the S1 nerve root was identified and gradually mobilized. Underneath the nerve root was identified a fragment of disc that was lifting the nerve root dorsally. This was removed there is  an opening into the disc space at this level and immediately several fragments of disc from within the disc space could be seen and evacuated the disc space was then further dissected and a number of fragments were encountered. With this we decided to do a more formal discectomy and open ligament further to expose the bony endplates are was evidence of significant disc material in the subligamentous space both behind the body of L5 and behind the sacrum itself this was carefully dissected free and the thickened redundant ligament in this region was removed along with the disc material a discectomy was then performed using a combination of curettes and rongeurs to remove all the disc material that was able to be reached via this aperture. Dr. Renae Fickle worked laterally while I worked medially he also provided substantial retraction of the dura while the discectomy continued. In the end was able to palpate the common dural tube the S1 nerve root without any substantial mass underneath it the disc space was evacuated and irrigated copiously with a micro-ear gait are heard removed and loosen any further fragments of material within the disc space. All this dissection was done with the use of the operating microscope.    After adequate decompression was obtained hemostasis and the soft tissues was verified retractor was removed the operating microscope was removed from the field and the lumbar dorsal fascia was closed with #1 and 2-0 Vicryl in interrupted fashion. 3-0 Vicryl was used to close subcuticular skin. Blood loss was estimated at 50 cc.

## 2011-04-09 NOTE — Anesthesia Procedure Notes (Signed)
Procedure Name: Intubation Date/Time: 04/09/2011 1:56 PM Performed by: Tenleigh Byer Pre-anesthesia Checklist: Patient identified, Emergency Drugs available, Suction available, Patient being monitored and Timeout performed Patient Re-evaluated:Patient Re-evaluated prior to inductionOxygen Delivery Method: Circle system utilized Preoxygenation: Pre-oxygenation with 100% oxygen Intubation Type: IV induction Ventilation: Mask ventilation without difficulty Laryngoscope Size: Mac and 4 Grade View: Grade I Tube type: Oral Tube size: 7.5 mm Number of attempts: 1 Airway Equipment and Method: Stylet Placement Confirmation: ETT inserted through vocal cords under direct vision,  positive ETCO2 and breath sounds checked- equal and bilateral Secured at: 23 cm Tube secured with: Tape Dental Injury: Teeth and Oropharynx as per pre-operative assessment

## 2011-04-09 NOTE — Preoperative (Addendum)
Beta Blockers   Reason not to administer Beta Blockers:Not Applicable 

## 2011-04-10 ENCOUNTER — Encounter (HOSPITAL_COMMUNITY): Payer: Self-pay | Admitting: Neurological Surgery

## 2011-04-10 MED ORDER — OXYCODONE-ACETAMINOPHEN 5-325 MG PO TABS: 1.0000 | ORAL_TABLET | ORAL | Status: AC | PRN

## 2011-04-10 NOTE — Discharge Summary (Signed)
Physician Discharge Summary  Patient ID: Jerry Campbell MRN: [id number] DOB/AGE: [birth year] 61 y.o.  Admit date: 04/09/2011 Discharge date: 04/10/2011  Admission Diagnoses: Herniated nucleus pulposus L5-S1 left with left lumbar radiculopathy  Discharge Diagnoses: Herniated nucleus pulposus L5-S1 left with left lumbar radiculopathy  Active Problems:  * No active hospital problems. *    Discharged Condition: good  Hospital Course: Patient was admitted to undergo surgical decompression of L5-S1 on left side he tolerated the surgery well he was maintained in the hospital overnight as he had a history of some urinary retention in the past this did not seem to be a problem during the postoperative course.  Consults: None  Significant Diagnostic Studies: MRI lumbar demonstrating herniated nucleus pulposus L5-S1 left  Treatments: surgery: Microdiscectomy L5-S1 left with operating microscope microdissection technique  Discharge Exam: Blood pressure 113/77, pulse 60, temperature 97.6 F (36.4 C), temperature source Oral, resp. rate 18, SpO2 99.00%. Incision/Wound: Incision is clean and dry motor function is normal and lower extremities  Disposition: 01-Home or Self Care  Discharge Orders    Future Orders Please Complete By Expires   Diet - low sodium heart healthy      Increase activity slowly      Discharge instructions      Comments:   No lifting greater than 15 pounds, sit straight walk straight stand straight. Mind your posture. Okay to shower. No salves on incision for at least 2 weeks. May resume driving when not requiring pain medication.   Call MD for:  temperature >100.4      Call MD for:  severe uncontrolled pain      Call MD for:  redness, tenderness, or signs of infection (pain, swelling, redness, odor or green/yellow discharge around incision site)        Medication List  As of 04/10/2011 10:36 AM   TAKE these medications         aspirin EC 81 MG tablet   Take 81 mg by  mouth daily.      cholecalciferol 1000 UNITS tablet   Commonly known as: VITAMIN D   Take 1,000 Units by mouth daily.      diazepam 10 MG tablet   Commonly known as: VALIUM   Take 10 mg by mouth every 6 (six) hours as needed. For anxiety/pain      fish oil-omega-3 fatty acids 1000 MG capsule   Take 1 g by mouth daily.      glucosamine-chondroitin 500-400 MG tablet   Take 1 tablet by mouth 3 (three) times daily.      HYDROcodone-acetaminophen 10-650 MG per tablet   Commonly known as: LORCET   Take 1 tablet by mouth every 6 (six) hours as needed. For pain      Melatonin 5 MG Caps   Take 1 capsule by mouth at bedtime as needed. For sleep      multivitamin with minerals tablet   Take 1 tablet by mouth daily.      oxyCODONE-acetaminophen 5-325 MG per tablet   Commonly known as: PERCOCET   Take 1-2 tablets by mouth every 4 (four) hours as needed for pain.      senna 8.6 MG Tabs   Commonly known as: SENOKOT   Take 1 tablet by mouth daily as needed. For constipation      simvastatin 10 MG tablet   Commonly known as: ZOCOR   Take 10 mg by mouth at bedtime.  SignedStefani Dama 04/10/2011, 10:36 AM

## 2011-04-10 NOTE — Discharge Instructions (Signed)
Herniated Disk The bones of your spinal column (vertebrae) protect your spinal cord and nerves that go into your arms and legs. The vertebrae are separated by disks that cushion the spinal column and put space between your vertebrae. This allows movement between the vertebrae, which allows you to bend, rotate, and move your body from side to side. Sometimes, the disks move out of place (herniate) or break open (rupture) from injury or strain. The most common area for a disk herniation is in the lower back (lumbar area). Sometimes herniation occurs in the neck (cervical) disks.  CAUSES  As we grow older, the strong, fibrous cords that connect the vertebrae and support and surround the disks (ligaments) start to weaken. A strain on the back may cause a break in the disk ligaments. RISK FACTORS Herniated disks occur most often in men who are aged 18 years to 35 years, usually after strenuous activity. Other risk factors include conditions present at birth (congenital) that affect the size of the lumbar spinal canal. Additionally, a narrowing of the areas where the nerves exit the spinal canal can occur as you age. SYMPTOMS  Symptoms of a herniated disk vary. You may have weakness in certain muscles. This weakness can include difficulty lifting your leg or arm, difficulty standing on your toes on one side, or difficulty squeezing tightly with one of your hands. You may have numbness. You may feel a mild tingling, dull ache, or a burning or pulsating pain. In some cases, the pain is severe enough that you are unable to move. The pain most often occurs on one side of the body. The pain often starts slowly. It may get worse:  After you sit or stand.   At night.   When you sneeze, cough, or laugh.   When you bend backwards or walk more than a few yards.  The pain, numbness, or weakness will often go away or improve a lot over a period of weeks to months. Herniated lumbar disk Symptoms of a herniated  lumbar disk may include sharp pain in one part of your leg, hip, or buttocks and numbness in other parts. You also may feel pain or numbness on the back of your calf or the top or sole of your foot. The same leg also may feel weak. Herniated cervical disk Symptoms of a herniated cervical disk may include pain when you move your neck, deep pain near or over your shoulder blade, or pain that moves to your upper arm, forearm, or fingers. DIAGNOSIS  To diagnose a herniated disk, your caregiver will perform a physical exam. Your caregiver also may perform diagnostic tests to see your disk or to test the reaction of your muscles and the function of your nerves. During the physical exam, your caregiver may ask you to:  Sit, stand, and walk. While you walk, your caregiver may ask you to try walking on your toes and then your heels.   Bend forward, backward, and sideways.   Raise your shoulders, elbow, wrist, and fingers and check your strength during these tasks.  Your caregiver will check for:  Numbness or loss of feeling.   Muscle reflexes, which may be slower or missing.   Muscle strength, which may be weaker.   Posture or the way your spine curves.  Diagnostic tests that may be done include:  A spinal X-ray exam to rule out other causes of back pain.   Magnetic resonance imaging (MRI) or computed tomography (CT) scan, which will show   if the herniated disk is pressing on your spinal canal.   Electromyography. This is sometimes used to identify the specific area of nerve involvement.  TREATMENT  Initial treatment for a herniated disk is a short period of rest with medicines for pain. Pain medicines can include nonsteroidal anti-inflammatory medicines (NSAIDs), muscle relaxants for back spasms, and (rarely) narcotic pain medicine for severe pain that does not respond to NSAID use. Bed rest is often limited to 1 or 2 days at the most because prolonged rest can delay recovery. When the herniation  involves the lower back, sitting should be avoided as much as possible because sitting increases pressure on the ruptured disk. Sometimes a soft neck collar will be prescribed for a few days to weeks to help support your neck in the case of a cervical herniation. Physical therapy is often prescribed for patients with disk disease. Physical therapists will teach you how to properly lift, dress, walk, and perform other activities. They will work on strengthening the muscles that help support your spine. In some cases, physical therapy alone is not enough to treat a herniated disk. Steroid injections along the involved nerve root may be needed to help control pain. The steroid is injected in the area of the herniated disk and helps by reducing swelling around the disk. Sometimes surgery is the best option to treat a herniated disk.  SEEK IMMEDIATE MEDICAL CARE IF:   You have numbness, tingling, weakness, or problems with the use of your arms or legs.   You have severe headaches that are not relieved with the use of medicines.   You notice a change in your bowel or bladder control.   You have increasing pain in any areas of your body.   You experience shortness of breath, dizziness, or fainting.  MAKE SURE YOU:   Understand these instructions.   Will watch your condition.   Will get help right away if you are not doing well or get worse.  Document Released: 01/06/2000 Document Revised: 12/28/2010 Document Reviewed: 08/11/2010 [employer] Patient Information 2012 [employer]. 

## 2011-05-22 ENCOUNTER — Ambulatory Visit: Payer: 59 | Attending: Neurological Surgery | Admitting: Rehabilitation

## 2011-05-22 DIAGNOSIS — R293 Abnormal posture: Secondary | ICD-10-CM | POA: Insufficient documentation

## 2011-05-22 DIAGNOSIS — M545 Low back pain, unspecified: Secondary | ICD-10-CM | POA: Insufficient documentation

## 2011-05-22 DIAGNOSIS — IMO0001 Reserved for inherently not codable concepts without codable children: Secondary | ICD-10-CM | POA: Insufficient documentation

## 2011-05-22 DIAGNOSIS — M2560 Stiffness of unspecified joint, not elsewhere classified: Secondary | ICD-10-CM | POA: Insufficient documentation

## 2011-06-12 ENCOUNTER — Encounter: Payer: 59 | Admitting: Rehabilitation

## 2011-06-12 ENCOUNTER — Ambulatory Visit: Payer: 59 | Attending: Neurological Surgery | Admitting: Rehabilitation

## 2011-06-12 DIAGNOSIS — M545 Low back pain, unspecified: Secondary | ICD-10-CM | POA: Insufficient documentation

## 2011-06-12 DIAGNOSIS — M2560 Stiffness of unspecified joint, not elsewhere classified: Secondary | ICD-10-CM | POA: Insufficient documentation

## 2011-06-12 DIAGNOSIS — IMO0001 Reserved for inherently not codable concepts without codable children: Secondary | ICD-10-CM | POA: Insufficient documentation

## 2011-06-12 DIAGNOSIS — R293 Abnormal posture: Secondary | ICD-10-CM | POA: Insufficient documentation

## 2011-06-14 ENCOUNTER — Ambulatory Visit: Payer: 59 | Admitting: Rehabilitation

## 2011-06-19 ENCOUNTER — Ambulatory Visit: Payer: 59 | Admitting: Rehabilitation

## 2011-06-21 ENCOUNTER — Ambulatory Visit: Payer: 59 | Admitting: Rehabilitation

## 2011-06-26 ENCOUNTER — Ambulatory Visit: Payer: 59 | Attending: Neurological Surgery | Admitting: Rehabilitation

## 2011-06-26 DIAGNOSIS — IMO0001 Reserved for inherently not codable concepts without codable children: Secondary | ICD-10-CM | POA: Insufficient documentation

## 2011-06-26 DIAGNOSIS — M545 Low back pain, unspecified: Secondary | ICD-10-CM | POA: Insufficient documentation

## 2011-06-26 DIAGNOSIS — M2560 Stiffness of unspecified joint, not elsewhere classified: Secondary | ICD-10-CM | POA: Insufficient documentation

## 2011-06-26 DIAGNOSIS — R293 Abnormal posture: Secondary | ICD-10-CM | POA: Insufficient documentation

## 2011-06-28 ENCOUNTER — Ambulatory Visit: Payer: 59 | Admitting: Rehabilitation

## 2011-07-03 ENCOUNTER — Ambulatory Visit: Payer: 59 | Admitting: Rehabilitation

## 2011-07-05 ENCOUNTER — Ambulatory Visit: Payer: 59 | Admitting: Rehabilitation

## 2011-07-10 ENCOUNTER — Ambulatory Visit: Payer: 59 | Admitting: Rehabilitation

## 2011-07-17 ENCOUNTER — Ambulatory Visit: Payer: 59 | Admitting: Rehabilitation

## 2011-07-19 ENCOUNTER — Ambulatory Visit: Payer: 59 | Admitting: Rehabilitation

## 2011-07-24 ENCOUNTER — Ambulatory Visit: Payer: 59 | Attending: Rehabilitation | Admitting: Rehabilitation

## 2011-07-24 DIAGNOSIS — M2560 Stiffness of unspecified joint, not elsewhere classified: Secondary | ICD-10-CM | POA: Insufficient documentation

## 2011-07-24 DIAGNOSIS — R293 Abnormal posture: Secondary | ICD-10-CM | POA: Insufficient documentation

## 2011-07-24 DIAGNOSIS — M545 Low back pain, unspecified: Secondary | ICD-10-CM | POA: Insufficient documentation

## 2011-07-24 DIAGNOSIS — IMO0001 Reserved for inherently not codable concepts without codable children: Secondary | ICD-10-CM | POA: Insufficient documentation

## 2011-08-02 ENCOUNTER — Ambulatory Visit: Payer: 59 | Admitting: Rehabilitation

## 2011-08-07 ENCOUNTER — Ambulatory Visit: Payer: 59 | Admitting: Rehabilitation

## 2011-08-09 ENCOUNTER — Ambulatory Visit: Payer: 59 | Admitting: Rehabilitation

## 2011-10-25 ENCOUNTER — Other Ambulatory Visit (INDEPENDENT_AMBULATORY_CARE_PROVIDER_SITE_OTHER): Payer: 59 | Admitting: Internal Medicine

## 2011-10-25 DIAGNOSIS — Z0000 Encounter for general adult medical examination without abnormal findings: Secondary | ICD-10-CM

## 2011-10-25 DIAGNOSIS — E785 Hyperlipidemia, unspecified: Secondary | ICD-10-CM

## 2011-10-25 DIAGNOSIS — Z125 Encounter for screening for malignant neoplasm of prostate: Secondary | ICD-10-CM

## 2011-10-25 LAB — LIPID PANEL
HDL: 43 mg/dL (ref >39)
LDL Cholesterol: 73 mg/dL (ref 0–99)
Total CHOL/HDL Ratio: 3.1 Ratio
VLDL: 19 mg/dL (ref 0–40)

## 2011-10-25 LAB — COMPREHENSIVE METABOLIC PANEL
AST: 16 U/L (ref 0–37)
Albumin: 4.4 g/dL (ref 3.5–5.2)
Alkaline Phosphatase: 44 U/L (ref 39–117)
BUN: 20 mg/dL (ref 6–23)
Creat: 1.07 mg/dL (ref 0.50–1.35)
Potassium: 4.4 mEq/L (ref 3.5–5.3)
Total Bilirubin: 0.9 mg/dL (ref 0.3–1.2)

## 2011-10-25 LAB — CBC WITH DIFFERENTIAL/PLATELET
Basophils Absolute: 0.1 10*3/uL (ref 0.0–0.1)
Basophils Relative: 1 % (ref 0–1)
Eosinophils Absolute: 0.1 10*3/uL (ref 0.0–0.7)
HCT: 46.6 % (ref 39.0–52.0)
MCHC: 34.5 g/dL (ref 30.0–36.0)
Monocytes Absolute: 0.3 10*3/uL (ref 0.1–1.0)
Neutro Abs: 3.3 10*3/uL (ref 1.7–7.7)
RDW: 14.2 % (ref 11.5–15.5)

## 2011-10-25 LAB — PSA: PSA: 0.63 ng/mL (ref <=4.00)

## 2011-10-26 ENCOUNTER — Encounter: Payer: Self-pay | Admitting: Internal Medicine

## 2011-10-26 ENCOUNTER — Ambulatory Visit (INDEPENDENT_AMBULATORY_CARE_PROVIDER_SITE_OTHER): Payer: 59 | Admitting: Internal Medicine

## 2011-10-26 VITALS — BP 122/84 | HR 76 | Temp 98.2°F | Ht 67.5 in | Wt 165.0 lb

## 2011-10-26 DIAGNOSIS — D126 Benign neoplasm of colon, unspecified: Secondary | ICD-10-CM

## 2011-10-26 DIAGNOSIS — K635 Polyp of colon: Secondary | ICD-10-CM

## 2011-10-26 DIAGNOSIS — Z87898 Personal history of other specified conditions: Secondary | ICD-10-CM

## 2011-10-26 DIAGNOSIS — M25512 Pain in left shoulder: Secondary | ICD-10-CM

## 2011-10-26 DIAGNOSIS — J309 Allergic rhinitis, unspecified: Secondary | ICD-10-CM

## 2011-10-26 DIAGNOSIS — Z23 Encounter for immunization: Secondary | ICD-10-CM

## 2011-10-26 DIAGNOSIS — E785 Hyperlipidemia, unspecified: Secondary | ICD-10-CM

## 2011-10-26 DIAGNOSIS — Z9189 Other specified personal risk factors, not elsewhere classified: Secondary | ICD-10-CM

## 2011-10-26 DIAGNOSIS — M25519 Pain in unspecified shoulder: Secondary | ICD-10-CM

## 2011-10-26 LAB — POCT URINALYSIS DIPSTICK
Blood, UA: NEGATIVE
Nitrite, UA: NEGATIVE
Spec Grav, UA: 1.015
Urobilinogen, UA: NEGATIVE
pH, UA: 6

## 2011-12-10 ENCOUNTER — Other Ambulatory Visit: Payer: Self-pay | Admitting: Internal Medicine

## 2011-12-13 ENCOUNTER — Encounter: Payer: Self-pay | Admitting: Internal Medicine

## 2011-12-13 ENCOUNTER — Ambulatory Visit (INDEPENDENT_AMBULATORY_CARE_PROVIDER_SITE_OTHER): Payer: 59 | Admitting: Internal Medicine

## 2011-12-13 VITALS — BP 138/96 | Temp 97.4°F | Wt 166.0 lb

## 2011-12-13 DIAGNOSIS — M25512 Pain in left shoulder: Secondary | ICD-10-CM | POA: Insufficient documentation

## 2011-12-13 DIAGNOSIS — M25519 Pain in unspecified shoulder: Secondary | ICD-10-CM

## 2011-12-13 NOTE — Progress Notes (Signed)
  Subjective:    Patient ID: Jerry Campbell, male    DOB: 01-07-1951, 61 y.o.   MRN: [id number]  HPI 61 year old white male who underwent lumbar disc surgery spring 2013 for decompression of S1 nerve root on the left. Patient says when he awakened from surgery he had some paresthesias in his left arm. This subsequently disappeared. Now complaining of some left shoulder pain. In December 2005 he tripped and was pulled down by a dog landing on outstretched left upper extremity. X-ray showed no fractures but rotator cuff tear was suspected. He was seen by Dr. Rennis Chris. MRI showed nondisplaced greater tuberosity fracture. MRI showed a longitudinal split tear long head of the biceps tendon as well as a questionable anterior labral tear. Conservative treatment was undertaken. Patient had physical therapy. By March 2006, patient continued to complain of some discomfort with certain positions of lifting and reaching. It was felt he had subacromial bursitis and he had an injection of Xylocaine and Celestone in the subacromial space. With time he seemed to improve.  He was seen for physical examination in October. At that time he was complaining of some paresthesias in his hand. Nerve conduction studies were undertaken. He is now wearing a tennis elbow splint [facility] after nerve conduction studies were done.  Now says that he's been having some left shoulder pain in his biceps area. He at times also has pain in his left neck and shoulder. He is concerned about the possibility of a cervical disc. Says his left grip is sometimes weak.  His range of motion in the left shoulder is excellent. He has some pain medication but has not been taking it.    Review of Systems     Objective:   Physical Exam Excellent range of motion left upper extremity. Muscle strength appears to be normal today in the left upper extremity. Sensation is intact.       Assessment & Plan:  Left shoulder  arthropathy  Plan: Suggest patient have  MRI of left shoulder. Further recommendations pending that report.

## 2011-12-13 NOTE — Patient Instructions (Addendum)
We will order MRI of the left shoulder for evaluation of left shoulder pain

## 2011-12-26 ENCOUNTER — Ambulatory Visit
Admission: RE | Admit: 2011-12-26 | Discharge: 2011-12-26 | Disposition: A | Discharge status: Home / Self Care | Payer: 59 | Source: Ambulatory Visit | Attending: Internal Medicine | Admitting: Internal Medicine

## 2011-12-28 ENCOUNTER — Telehealth: Payer: Self-pay

## 2011-12-28 DIAGNOSIS — M5410 Radiculopathy, site unspecified: Secondary | ICD-10-CM

## 2011-12-28 NOTE — Progress Notes (Signed)
Patient and wife informed. Still having pain, so will schedule MRI of CSpine

## 2011-12-31 ENCOUNTER — Telehealth: Payer: Self-pay

## 2011-12-31 NOTE — Telephone Encounter (Signed)
Patient scheduled for MRI of C Spine on 01/04/2012 at 7:45 am at [facility] imaging. He is aware of this appointment

## 2012-01-04 ENCOUNTER — Ambulatory Visit
Admission: RE | Admit: 2012-01-04 | Discharge: 2012-01-04 | Disposition: A | Discharge status: Home / Self Care | Payer: 59 | Source: Ambulatory Visit | Attending: Internal Medicine | Admitting: Internal Medicine

## 2012-01-04 DIAGNOSIS — M5410 Radiculopathy, site unspecified: Secondary | ICD-10-CM

## 2012-01-07 NOTE — Progress Notes (Signed)
Patient informed, referral sent to Dr. Danielle Dess, who did his l spine surgery

## 2012-03-29 NOTE — Patient Instructions (Addendum)
MRI of the shoulder will be ordered. Further evaluation if MRI of the shoulder is negative. Continue statin medication.

## 2012-03-29 NOTE — Progress Notes (Signed)
Subjective:    Patient ID: Jerry Campbell, male    DOB: 10-20-1950, 62 y.o.   MRN: [id number]  HPI 62 year old white male for health maintenance and evaluation of medical problems. He has a history of hyperlipidemia and degenerative disc disease of the lumbar spine. And 2013 he had surgery for herniated disc at L5-S1 by Dr. Danielle Dess.  No known drug allergies. Is on Zocor 10 mg daily.  History of fractured left wrist secondary to a fall 1995. History of fractured left fifth toe 1980s. History of fracture right toe in karate early 1990s. Fractured left shoulder with possible rotator cuff tear December 2005. Had colonoscopy 2004. Had vasectomy 1991. Tetanus immunization June 2010. In July 2012 had allergy consultation for lingering cough. Spirometry revealed a mild restrictive pattern. Skin testing showed results that were positive to dust mites cockroach house dust and mold mix. He was treated with a Proventil inhaler.  Hospitalized in front of [facility] in June 2011 with an episode of confusion and forgetfulness. He had a CT of the brain which was negative in [place]. This was his third episode of confusion in his life. He was admitted to [facility] in November 2008 with similar presentation but had syncope. He had a  Very thorough evaluation by Doctors Graciela Husbands and Love. Symptoms have been known to start after giving a presentation to a group of coworkers. Was thought to have a concussion after a fall from a ladder in 1995. EEG in 2008 was negative. 2-D echocardiogram in 2008 was negative. Cardiolite study in 2008 was negative. MRI of the brain with and without contrast in August 2011 was normal.  History of hyperplastic colon polyps. Colonoscopy done 2004.  He is married. Wife has multiple sclerosis. 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  Family history: Father with history of MI. Father with history of prostate cancer, hyperlipidemia, glaucoma. Mother with  history of glaucoma, hypertension, hypothyroidism. One brother with history of MI in his early 17s. One sister in good health.  Social history: Does not smoke or consume alcohol. One stepdaughter.  Patient says he received Pneumovax immunization at work. Tetanus immunization 2010. No known drug allergies.        Review of Systems  Constitutional: Negative.   HENT: Negative.   Eyes: Negative.   Respiratory: Negative.   Cardiovascular:       History of syncope  Musculoskeletal:       Issues with left neck and shoulder. Some radiculopathy left arm. Not clear if issue is cervical neck or rotator cuff.  Neurological:       History of lumbar disc       Objective:   Physical Exam  Constitutional: He is oriented to person, place, and time. He appears well-developed and well-nourished. No distress.  HENT:  Head: Normocephalic and atraumatic.  Right Ear: External ear normal.  Left Ear: External ear normal.  Mouth/Throat: Oropharynx is clear and moist. No oropharyngeal exudate.  Eyes: Conjunctivae and EOM are normal. Left eye exhibits no discharge. No scleral icterus.  Neck: Normal range of motion. Neck supple. No JVD present. No thyromegaly present.  Cardiovascular: Normal rate, regular rhythm, normal heart sounds and intact distal pulses.   No murmur heard. Pulmonary/Chest: Effort normal and breath sounds normal. No respiratory distress. He has no wheezes. He has no rales.  Abdominal: Soft. Bowel sounds are normal. He exhibits no distension and no mass. There is no rebound and no guarding.  Genitourinary:  Prostate normal.  Musculoskeletal: Normal range of motion. He exhibits no edema.  Lymphadenopathy:    He has no cervical adenopathy.  Neurological: He is alert and oriented to person, place, and time. He has normal reflexes. No cranial nerve deficit. Coordination normal.  Skin: Skin is warm and dry. No rash noted. He is not diaphoretic.  Psychiatric: He has a normal mood and  affect. His behavior is normal. Judgment and thought content normal.          Assessment & Plan:  Complaint of left shoulder and neck pain. Possible left rotator cuff tear versus cervical radiculopathy. History of fractured shoulder.  History of lumbar disc disease  Hyperlipidemia  History of syncope  History of hyperplastic colon polyp  History of allergic rhinitis  History of back pain  Plan: MRI of the left shoulder. If negative, pursue MRI of the neck.  Addendum: 12/28/2011: MRI of the left shoulder shows no rotator cuff tear. MRI of the C-spine shows nerve root compression C7-T1. Referred to neurosurgeon.

## 2012-10-27 ENCOUNTER — Other Ambulatory Visit: Payer: BC Managed Care – PPO | Admitting: Internal Medicine

## 2012-10-27 DIAGNOSIS — E785 Hyperlipidemia, unspecified: Secondary | ICD-10-CM

## 2012-10-27 DIAGNOSIS — Z125 Encounter for screening for malignant neoplasm of prostate: Secondary | ICD-10-CM

## 2012-10-27 DIAGNOSIS — Z79899 Other long term (current) drug therapy: Secondary | ICD-10-CM

## 2012-10-27 DIAGNOSIS — Z130 Encounter for screening for diseases of the blood and blood-forming organs and certain disorders involving the immune mechanism: Secondary | ICD-10-CM

## 2012-10-27 LAB — CBC WITH DIFFERENTIAL/PLATELET
Basophils Absolute: 0.1 10*3/uL (ref 0.0–0.1)
Basophils Relative: 1 % (ref 0–1)
Eosinophils Absolute: 0.1 10*3/uL (ref 0.0–0.7)
Eosinophils Relative: 2 % (ref 0–5)
HCT: 46.2 % (ref 39.0–52.0)
MCH: 32.5 pg (ref 26.0–34.0)
MCHC: 34.4 g/dL (ref 30.0–36.0)
MCV: 94.5 fL (ref 78.0–100.0)
Monocytes Absolute: 0.3 10*3/uL (ref 0.1–1.0)
RDW: 14.2 % (ref 11.5–15.5)

## 2012-10-27 LAB — COMPREHENSIVE METABOLIC PANEL
AST: 16 U/L (ref 0–37)
Alkaline Phosphatase: 43 U/L (ref 39–117)
BUN: 17 mg/dL (ref 6–23)
Calcium: 9.5 mg/dL (ref 8.4–10.5)
Chloride: 108 mEq/L (ref 96–112)
Creat: 1.03 mg/dL (ref 0.50–1.35)
Total Bilirubin: 0.7 mg/dL (ref 0.3–1.2)

## 2012-10-27 LAB — LIPID PANEL
HDL: 45 mg/dL (ref >39)
Total CHOL/HDL Ratio: 3.3 Ratio
VLDL: 17 mg/dL (ref 0–40)

## 2012-10-28 ENCOUNTER — Encounter: Payer: Self-pay | Admitting: Internal Medicine

## 2012-10-28 LAB — PSA: PSA: 0.58 ng/mL (ref <=4.00)

## 2012-10-30 ENCOUNTER — Encounter: Payer: Self-pay | Admitting: Internal Medicine

## 2012-10-30 ENCOUNTER — Ambulatory Visit (INDEPENDENT_AMBULATORY_CARE_PROVIDER_SITE_OTHER): Payer: BC Managed Care – PPO | Admitting: Internal Medicine

## 2012-10-30 VITALS — BP 118/82 | HR 80 | Temp 98.1°F | Ht 68.0 in | Wt 162.0 lb

## 2012-10-30 DIAGNOSIS — Z0000 Encounter for general adult medical examination without abnormal findings: Secondary | ICD-10-CM

## 2012-10-30 DIAGNOSIS — Z8739 Personal history of other diseases of the musculoskeletal system and connective tissue: Secondary | ICD-10-CM

## 2012-10-30 DIAGNOSIS — M25519 Pain in unspecified shoulder: Secondary | ICD-10-CM

## 2012-10-30 DIAGNOSIS — J309 Allergic rhinitis, unspecified: Secondary | ICD-10-CM

## 2012-10-30 DIAGNOSIS — M25512 Pain in left shoulder: Secondary | ICD-10-CM

## 2012-10-30 DIAGNOSIS — Z23 Encounter for immunization: Secondary | ICD-10-CM

## 2012-10-30 DIAGNOSIS — M5090 Cervical disc disorder, unspecified, unspecified cervical region: Secondary | ICD-10-CM

## 2012-10-30 DIAGNOSIS — E785 Hyperlipidemia, unspecified: Secondary | ICD-10-CM

## 2012-10-30 LAB — POCT URINALYSIS DIPSTICK
Bilirubin, UA: NEGATIVE
Blood, UA: NEGATIVE
Nitrite, UA: NEGATIVE
pH, UA: 6.5

## 2012-10-30 NOTE — Progress Notes (Signed)
Subjective:    Patient ID: Jerry Campbell, male    DOB: [birth year], 62 y.o.   MRN: [id number]  HPI  62 year old male with history of low back pain now in [employer] with [employer].  History of  Persistent left shoulder pain s/p shoulder fracture with neck MRI done 2013 (see report showing cervical disc disease with mild impingement C-spine multilevel). Hx of lumbar back surgery for herniated disc at L5-S1 by Dr. Danielle Dess in 2013. History of hyperlipidemia. He is on Zocor.  History of fractured left wrist secondary to a fall in 1995. History of fractured left fifth toe in the 1980s. History of fractured right toe in karate in the early 1990s. Fractured left shoulder with possible rotator cuff tear December 2005. Had vasectomy 1991. Colonoscopy 2004. History of hyperplastic colon polyps.  In July 2012 he had an allergy consultation for  lingering cough. Spirometry revealed a mild restrictive pattern. Skin tests showed results were positive to dust mites, cockroach, house dust and mold mix. He was treated with her Proventil inhaler.  Tetanus immunization June 2010.  He was hospitalized in [place] in June 2011 with an episode of confusion and forgetfulness. He had a CT of the brain which was negative. This was his third episode of confusion in his life. He was admitted to [facility] in November 2008 with a similar presentation but had syncope. He had a very thorough evaluating by [employer] and Love. Symptoms have been known to start after giving a presentation to a group of coworkers. EEG in 2008 was negative. 2-D echocardiogram at that time was negative as well as Cardiolite study. MRI of the brain with and without contrast in August 2011 was normal. He has had no further episodes.  Patient says he was thought to have had a concussion from a fall from a ladder in 1995.  FHx: Mother with dementia. Father with history of MI and prostate cancer as well as hyperlipidemia and glaucoma. Mother also has  glaucoma hypertension and hypothyroidism. One brother with history of MI in his early 69s. One sister in good health.  Social history: He is married. 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 One stepdaughter.  No known drug allergies.  Patient had tetanus immunization in 2010 and says he received Pneumovax immunization at work.  Review of Systems  Constitutional: Negative.   HENT: Negative.   Eyes: Negative.   Endocrine: Negative.   Musculoskeletal:       History of left shoulder pain status post fracture. History of low back pain.  Allergic/Immunologic: Positive for environmental allergies.  Neurological:       History of 3 episodes of confusion. One was associated with syncope.       Objective:   Physical Exam  Vitals reviewed. Constitutional: He is oriented to person, place, and time. He appears well-developed and well-nourished. No distress.  HENT:  Head: Normocephalic and atraumatic.  Right Ear: External ear normal.  Left Ear: External ear normal.  Mouth/Throat: Oropharynx is clear and moist. No oropharyngeal exudate.  Eyes: Conjunctivae and EOM are normal. Pupils are equal, round, and reactive to light. Right eye exhibits no discharge. Left eye exhibits no discharge.  Neck: Neck supple. No JVD present. No thyromegaly present.  Cardiovascular: Normal rate, regular rhythm, normal heart sounds and intact distal pulses.   No murmur heard. Pulmonary/Chest: Effort normal and breath  sounds normal. He has no wheezes.  Genitourinary: Prostate normal.  Musculoskeletal: Normal range of motion. He exhibits no edema.  Neurological: He is alert and oriented to person, place, and time. He has normal reflexes. No cranial nerve deficit. Coordination normal.  Skin: Skin is warm and dry. No rash noted. He is not diaphoretic.  Psychiatric: He has a normal mood and affect. His behavior is  normal. Judgment and thought content normal.          Assessment & Plan:  History of low back pain status post herniated disc surgery L5-S1 2013.  Hyperlipidemia-stable on Zocor  History of left shoulder pain status post fracture 2005. Cervical disc disease  History of allergic rhinitis  History of unexplained confusion and syncope x3  History of hyperplastic colon polyps  Plan: Continue statin therapy and return in one year or as needed.

## 2012-11-23 DIAGNOSIS — M5090 Cervical disc disorder, unspecified, unspecified cervical region: Secondary | ICD-10-CM | POA: Insufficient documentation

## 2012-11-23 NOTE — Patient Instructions (Addendum)
Return in one year. Continue same medication.

## 2012-12-21 ENCOUNTER — Other Ambulatory Visit: Payer: Self-pay | Admitting: Internal Medicine

## 2013-10-26 ENCOUNTER — Other Ambulatory Visit: Payer: BC Managed Care – PPO | Admitting: Internal Medicine

## 2013-10-26 DIAGNOSIS — Z125 Encounter for screening for malignant neoplasm of prostate: Secondary | ICD-10-CM

## 2013-10-26 DIAGNOSIS — Z130 Encounter for screening for diseases of the blood and blood-forming organs and certain disorders involving the immune mechanism: Secondary | ICD-10-CM

## 2013-10-26 DIAGNOSIS — E785 Hyperlipidemia, unspecified: Secondary | ICD-10-CM

## 2013-10-26 DIAGNOSIS — Z0000 Encounter for general adult medical examination without abnormal findings: Secondary | ICD-10-CM

## 2013-10-26 LAB — COMPREHENSIVE METABOLIC PANEL
ALT: 15 U/L (ref 0–53)
AST: 17 U/L (ref 0–37)
Albumin: 4.3 g/dL (ref 3.5–5.2)
Alkaline Phosphatase: 43 U/L (ref 39–117)
BILIRUBIN TOTAL: 0.7 mg/dL (ref 0.2–1.2)
BUN: 16 mg/dL (ref 6–23)
CO2: 28 mEq/L (ref 19–32)
Calcium: 9.3 mg/dL (ref 8.4–10.5)
Chloride: 105 mEq/L (ref 96–112)
Creat: 1.22 mg/dL (ref 0.50–1.35)
Glucose, Bld: 76 mg/dL (ref 70–99)
Potassium: 4.7 mEq/L (ref 3.5–5.3)
Sodium: 142 mEq/L (ref 135–145)
Total Protein: 6.6 g/dL (ref 6.0–8.3)

## 2013-10-26 LAB — CBC WITH DIFFERENTIAL/PLATELET
BASOS ABS: 0 10*3/uL (ref 0.0–0.1)
Basophils Relative: 1 % (ref 0–1)
EOS PCT: 3 % (ref 0–5)
Eosinophils Absolute: 0.1 10*3/uL (ref 0.0–0.7)
HCT: 45.8 % (ref 39.0–52.0)
Hemoglobin: 15.7 g/dL (ref 13.0–17.0)
LYMPHS ABS: 1.7 10*3/uL (ref 0.7–4.0)
LYMPHS PCT: 36 % (ref 12–46)
MCH: 31.4 pg (ref 26.0–34.0)
MCHC: 34.3 g/dL (ref 30.0–36.0)
MCV: 91.6 fL (ref 78.0–100.0)
Monocytes Absolute: 0.3 10*3/uL (ref 0.1–1.0)
Monocytes Relative: 7 % (ref 3–12)
NEUTROS ABS: 2.5 10*3/uL (ref 1.7–7.7)
NEUTROS PCT: 53 % (ref 43–77)
PLATELETS: 199 10*3/uL (ref 150–400)
RBC: 5 MIL/uL (ref 4.22–5.81)
RDW: 14.3 % (ref 11.5–15.5)
WBC: 4.7 10*3/uL (ref 4.0–10.5)

## 2013-10-26 LAB — LIPID PANEL
CHOLESTEROL: 138 mg/dL (ref 0–200)
HDL: 47 mg/dL (ref >39)
LDL CALC: 74 mg/dL (ref 0–99)
Total CHOL/HDL Ratio: 2.9 Ratio
Triglycerides: 84 mg/dL (ref <150)
VLDL: 17 mg/dL (ref 0–40)

## 2013-10-27 LAB — PSA: PSA: 0.51 ng/mL (ref <=4.00)

## 2013-10-30 ENCOUNTER — Encounter: Payer: Self-pay | Admitting: Internal Medicine

## 2013-10-30 ENCOUNTER — Encounter: Payer: BC Managed Care – PPO | Admitting: Internal Medicine

## 2013-10-30 ENCOUNTER — Ambulatory Visit (INDEPENDENT_AMBULATORY_CARE_PROVIDER_SITE_OTHER): Payer: BC Managed Care – PPO | Admitting: Internal Medicine

## 2013-10-30 VITALS — BP 130/86 | HR 88 | Temp 97.8°F | Wt 161.0 lb

## 2013-10-30 DIAGNOSIS — J209 Acute bronchitis, unspecified: Secondary | ICD-10-CM

## 2013-10-30 MED ORDER — AMOXICILLIN-POT CLAVULANATE 500-125 MG PO TABS: 1.0000 | ORAL_TABLET | Freq: Three times a day (TID) | ORAL | Status: DC

## 2013-10-30 MED ORDER — METHYLPREDNISOLONE ACETATE 80 MG/ML IJ SUSP
80.0000 mg | Freq: Once | INTRAMUSCULAR | Status: AC
  Administered 2013-10-30: 80 mg via INTRAMUSCULAR

## 2013-10-30 NOTE — Patient Instructions (Signed)
Take Augmentin 500 mg 3 times daily for 10 days.

## 2013-10-30 NOTE — Progress Notes (Signed)
   Subjective:    Patient ID: Jerry Campbell, male    DOB: 1950-11-07, 63 y.o.   MRN: [id number]  HPI  63 year old Male in today with cough that started about 4 weeks ago with a respiratory infection. At that time he did have some slight discolored sputum production but no fever or chills. Has continued with intermittent hacking cough that won't go away. He is concerned because he is leaving to go on a trip to [place] next week for one week. He will be flying. Says he has no sputum production at the present time.    Review of Systems     Objective:   Physical Exam Skin warm and dry. Nodes none. Pharynx is clear. TMs are clear. Neck is supple without adenopathy. Chest clear to auscultation without rales or wheezing       Assessment & Plan:  Bronchitis  Plan: Augmentin 500 mg 3 times daily for 10 days. If needed he can take over-the-counter Delsym for cough.

## 2013-12-11 ENCOUNTER — Emergency Department (HOSPITAL_COMMUNITY)
Admission: EM | Admit: 2013-12-11 | Discharge: 2013-12-12 | Disposition: A | Discharge status: Home / Self Care | Payer: BC Managed Care – PPO | Attending: Emergency Medicine | Admitting: Emergency Medicine

## 2013-12-11 ENCOUNTER — Encounter (HOSPITAL_COMMUNITY): Payer: Self-pay | Admitting: *Deleted

## 2013-12-11 ENCOUNTER — Emergency Department (HOSPITAL_COMMUNITY): Payer: BC Managed Care – PPO

## 2013-12-11 ENCOUNTER — Telehealth: Payer: Self-pay

## 2013-12-11 DIAGNOSIS — Z8719 Personal history of other diseases of the digestive system: Secondary | ICD-10-CM | POA: Diagnosis not present

## 2013-12-11 DIAGNOSIS — Z7982 Long term (current) use of aspirin: Secondary | ICD-10-CM | POA: Insufficient documentation

## 2013-12-11 DIAGNOSIS — Z8739 Personal history of other diseases of the musculoskeletal system and connective tissue: Secondary | ICD-10-CM | POA: Diagnosis not present

## 2013-12-11 DIAGNOSIS — Z87442 Personal history of urinary calculi: Secondary | ICD-10-CM | POA: Diagnosis not present

## 2013-12-11 DIAGNOSIS — R413 Other amnesia: Secondary | ICD-10-CM | POA: Insufficient documentation

## 2013-12-11 DIAGNOSIS — G454 Transient global amnesia: Secondary | ICD-10-CM

## 2013-12-11 DIAGNOSIS — E785 Hyperlipidemia, unspecified: Secondary | ICD-10-CM | POA: Insufficient documentation

## 2013-12-11 DIAGNOSIS — G8929 Other chronic pain: Secondary | ICD-10-CM | POA: Insufficient documentation

## 2013-12-11 DIAGNOSIS — Z79899 Other long term (current) drug therapy: Secondary | ICD-10-CM | POA: Insufficient documentation

## 2013-12-11 HISTORY — DX: Transient global amnesia: G45.4

## 2013-12-11 LAB — COMPREHENSIVE METABOLIC PANEL
ALBUMIN: 4.2 g/dL (ref 3.5–5.2)
ALT: 22 U/L (ref 0–53)
AST: 19 U/L (ref 0–37)
Alkaline Phosphatase: 48 U/L (ref 39–117)
Anion gap: 12 (ref 5–15)
BUN: 19 mg/dL (ref 6–23)
CO2: 27 mEq/L (ref 19–32)
CREATININE: 1.11 mg/dL (ref 0.50–1.35)
Calcium: 9.6 mg/dL (ref 8.4–10.5)
Chloride: 104 mEq/L (ref 96–112)
GFR calc Af Amer: 81 mL/min — ABNORMAL LOW (ref >90)
GFR, EST NON AFRICAN AMERICAN: 70 mL/min — AB (ref >90)
Glucose, Bld: 109 mg/dL — ABNORMAL HIGH (ref 70–99)
Potassium: 4.5 mEq/L (ref 3.7–5.3)
Sodium: 143 mEq/L (ref 137–147)
Total Bilirubin: 0.8 mg/dL (ref 0.3–1.2)
Total Protein: 7.1 g/dL (ref 6.0–8.3)

## 2013-12-11 LAB — CBC
HEMATOCRIT: 45.5 % (ref 39.0–52.0)
Hemoglobin: 15.7 g/dL (ref 13.0–17.0)
MCH: 31.5 pg (ref 26.0–34.0)
MCHC: 34.5 g/dL (ref 30.0–36.0)
MCV: 91.2 fL (ref 78.0–100.0)
Platelets: 211 10*3/uL (ref 150–400)
RBC: 4.99 MIL/uL (ref 4.22–5.81)
RDW: 13.5 % (ref 11.5–15.5)
WBC: 7 10*3/uL (ref 4.0–10.5)

## 2013-12-11 LAB — DIFFERENTIAL
BASOS ABS: 0 10*3/uL (ref 0.0–0.1)
BASOS PCT: 0 % (ref 0–1)
EOS PCT: 1 % (ref 0–5)
Eosinophils Absolute: 0.1 10*3/uL (ref 0.0–0.7)
Lymphocytes Relative: 20 % (ref 12–46)
Lymphs Abs: 1.4 10*3/uL (ref 0.7–4.0)
MONO ABS: 0.5 10*3/uL (ref 0.1–1.0)
Monocytes Relative: 6 % (ref 3–12)
Neutro Abs: 5.1 10*3/uL (ref 1.7–7.7)
Neutrophils Relative %: 73 % (ref 43–77)

## 2013-12-11 LAB — PROTIME-INR
INR: 1.01 (ref 0.00–1.49)
Prothrombin Time: 13.4 seconds (ref 11.6–15.2)

## 2013-12-11 LAB — APTT: aPTT: 30 seconds (ref 24–37)

## 2013-12-11 LAB — I-STAT TROPONIN, ED: TROPONIN I, POC: 0 ng/mL (ref 0.00–0.08)

## 2013-12-11 NOTE — Discharge Instructions (Signed)
If you were given medicines take as directed.  If you are on coumadin or contraceptives realize their levels and effectiveness is altered by many different medicines.  If you have any reaction (rash, tongues swelling, other) to the medicines stop taking and see a physician.   Please follow up as directed and return to the ER or see a physician for new or worsening symptoms.  Thank you. Filed Vitals:   12/11/13 1715 12/11/13 1745 12/11/13 2039 12/11/13 2314  BP: 175/106 155/93 153/96 137/85  Pulse: 71 66 63 59  Temp:   98.5 F (36.9 C) 98.4 F (36.9 C)  TempSrc:   Oral Oral  Resp: 18 15 19 16   Height:      Weight:      SpO2: 98% 96% 96% 96%

## 2013-12-11 NOTE — ED Notes (Signed)
Pt transported to CT ?

## 2013-12-11 NOTE — ED Provider Notes (Signed)
CSN: [id number]     Arrival date & time 12/11/13  1508 History   First MD Initiated Contact with Patient 12/11/13 1637     Chief Complaint  Patient presents with  . Memory Loss     (Consider location/radiation/quality/duration/timing/severity/associated sxs/prior Treatment) Patient is a 63 y.o. male presenting with neurologic complaint.  Neurologic Problem This is a recurrent problem. The current episode started today. The problem occurs constantly. The problem has been unchanged. Pertinent negatives include no abdominal pain, chest pain, chills, congestion, coughing, fever, headaches, neck pain, numbness or vomiting.    Past Medical History  Diagnosis Date  . DDD (degenerative disc disease)   . Hyperlipidemia     takes Simvastatin daily  . Hyperplastic colon polyp   . Constipation     d/t meds;taking an OTC stool softener daily(for the past 2 wks)  . Bronchitis     hx of-last time being about a yr ago  . Confusion     hx of-2008  . Chronic back pain     herniated disc,lumbago,spondylosis  . History of kidney stones 25+yrs ago  . Anxiety     takes Valium prn  . Transient global amnesia    Past Surgical History  Procedure Laterality Date  . Vasectomy  23yrs ago  . Umbilical hernia repair  10/10/2010  . Wisdom teeth extracted  early 80's  . Colonoscopy    . Lumbar laminectomy/decompression microdiscectomy  04/09/2011    Procedure: LUMBAR LAMINECTOMY/DECOMPRESSION MICRODISCECTOMY;  Surgeon: Kristeen Miss, MD;  Location: [facility];  Service: Neurosurgery;  Laterality: Left;  Left Lumbar five - sacral one Microdiskectomy   Family History  Problem Relation Age of Onset  . Cancer Mother   . Heart disease Mother   . Hypertension Mother   . Cancer Father   . Heart disease Brother   . Diabetes Paternal Grandmother   . Stroke Paternal Grandmother   . Anesthesia problems Neg Hx   . Hypotension Neg Hx   . Malignant hyperthermia Neg Hx   . Pseudochol deficiency Neg Hx     History  Substance Use Topics  . Smoking status: Never Smoker   . Smokeless tobacco: Not on file  . Alcohol Use: Yes     Comment: occasional beer    Review of Systems  Constitutional: Negative for fever and chills.  HENT: Negative for congestion and rhinorrhea.   Eyes: Negative for pain.  Respiratory: Negative for cough and shortness of breath.   Cardiovascular: Negative for chest pain and palpitations.  Gastrointestinal: Negative for vomiting, abdominal pain, diarrhea and constipation.  Endocrine: Negative for polydipsia and polyuria.  Genitourinary: Negative for dysuria and flank pain.  Musculoskeletal: Negative for back pain and neck pain.  Skin: Negative for color change and wound.  Neurological: Negative for dizziness, numbness and headaches.       No memory for today      Allergies  Review of patient's allergies indicates no known allergies.  Home Medications   Prior to Admission medications   Medication Sig Start Date End Date Taking? Authorizing Provider  amoxicillin-clavulanate (AUGMENTIN) 500-125 MG per tablet Take 1 tablet (500 mg total) by mouth 3 (three) times daily. 10/30/13   Elby Showers, MD  aspirin EC 81 MG tablet Take 81 mg by mouth daily.    Historical Provider, MD  cholecalciferol (VITAMIN D) 1000 UNITS tablet Take 1,000 Units by mouth daily.      Historical Provider, MD  fish oil-omega-3 fatty acids 1000 MG capsule  Take 1 g by mouth daily.    Historical Provider, MD  Melatonin 5 MG CAPS Take 1 capsule by mouth at bedtime as needed. For sleep    Historical Provider, MD  Multiple Vitamins-Minerals (MULTIVITAMIN WITH MINERALS) tablet Take 1 tablet by mouth daily.      Historical Provider, MD  simvastatin (ZOCOR) 10 MG tablet TAKE 1 TABLET BY MOUTH EVERY DAY 12/21/12   Elby Showers, MD   BP 161/91 mmHg  Pulse 79  Temp(Src) 98.2 F (36.8 C) (Oral)  Resp 12  Ht 5\' 8"  (1.727 m)  Wt 160 lb (72.576 kg)  BMI 24.33 kg/m2  SpO2 95% Physical Exam   Constitutional: He is oriented to person, place, and time. He appears well-developed and well-nourished.  HENT:  Head: Normocephalic and atraumatic.  Eyes: Conjunctivae and EOM are normal. Pupils are equal, round, and reactive to light.  Neck: Normal range of motion.  Cardiovascular: Normal rate and regular rhythm.   Pulmonary/Chest: Effort normal and breath sounds normal.  Abdominal: Soft. He exhibits no distension. There is no tenderness.  Musculoskeletal: Normal range of motion. He exhibits no edema or tenderness.  Neurological: He is alert and oriented to person, place, and time.  No altered mental status, not able to cooperate with past memories.  Face is symmetric, EOM's intact, pupils equal and reactive, vision intact, tongue and uvula midline without deviation Upper and Lower extremity motor 5/5, intact pain perception in distal extremities, 2+ reflexes in biceps, patella and achilles tendons. Finger to nose normal, heel to shin normal. Walks without assistance or evident ataxia.   Skin: Skin is warm and dry.  Nursing note and vitals reviewed.   ED Course  Procedures (including critical care time) Labs Review Labs Reviewed  COMPREHENSIVE METABOLIC PANEL - Abnormal; Notable for the following:    Glucose, Bld 109 (*)    GFR calc non Af Amer 70 (*)    GFR calc Af Amer 81 (*)    All other components within normal limits  PROTIME-INR  APTT  CBC  DIFFERENTIAL  I-STAT TROPOININ, ED    Imaging Review Ct Head (brain) Wo Contrast  12/11/2013   CLINICAL DATA:  Slight headache and memory loss today  EXAM: CT HEAD WITHOUT CONTRAST  TECHNIQUE: Contiguous axial images were obtained from the base of the skull through the vertex without intravenous contrast.  COMPARISON:  12/13/2006  FINDINGS: Minimal asymmetry of lateral ventricles LEFT larger than RIGHT, stable, within normal limits.  No midline shift or mass effect.  Otherwise normal appearance of brain parenchyma.  No intracranial  hemorrhage, mass lesion, or evidence acute infarction.  No extra-axial fluid collections.  Bones and sinuses unremarkable.  IMPRESSION: Minimal stable asymmetry of the lateral ventricles unchanged since 2008.  No acute intracranial abnormalities.   Electronically Signed   By: Lavonia Dana M.D.   On: 12/11/2013 17:41   Mr Brain Wo Contrast  12/11/2013   CLINICAL DATA:  Initial evaluation for memory loss.  EXAM: MRI HEAD WITHOUT CONTRAST  TECHNIQUE: Multiplanar, multiecho pulse sequences of the brain and surrounding structures were obtained without intravenous contrast.  COMPARISON:  Prior CT from 12/11/2013.  FINDINGS: The CSF containing spaces are within normal limits for patient age. No focal parenchymal signal abnormality is identified. No mass lesion, midline shift, or extra-axial fluid collection. Ventricles are normal in size without evidence of hydrocephalus.  No diffusion-weighted signal abnormality is identified to suggest acute intracranial infarct. Gray-white matter differentiation is maintained. Normal flow voids are  seen within the intracranial vasculature. No intracranial hemorrhage identified.  The cervicomedullary junction is normal. Pituitary gland is within normal limits. Pituitary stalk is midline. The globes and optic nerves demonstrate a normal appearance with normal signal intensity.  The bone marrow signal intensity is normal. Calvarium is intact. Visualized upper cervical spine is within normal limits.  Scalp soft tissues are unremarkable.  Paranasal sinuses are clear.  No mastoid effusion.  IMPRESSION: Normal MRI of the brain with no acute intracranial abnormality identified.   Electronically Signed   By: Jeannine Boga M.D.   On: 12/11/2013 22:30     EKG Interpretation None      MDM   Final diagnoses:  Memory loss    H/O TGA, likely same, however not returned to baseline. Possible seizure w/ prolonged post ictal state? Subclinical seizure? Will consult neuro and dispo  accordingly.   Neurology thinks TGA v psychogenic fugue, MRI to evaluate for stroke/evidence of TGA in hippocampus and MRI negative. Patient returned to baseline. Stable for d/c with normal neuro exam. 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    Merrily Pew, MD 12/12/13 0211  Mariea Clonts, MD 12/13/13 1552

## 2013-12-11 NOTE — Telephone Encounter (Signed)
Patients wife called with some concerns about the patient.  Patient is having transient global amnesia.  Patient does not remember driving.  His mother just passed away.  He is unsure of what year it is.  This has happen in the past.  Wife gave him one of her xanax to help him calm down.  Per Dr Renold Genta wants the patient to go to see the hospitalist neurospecialist.  Patient wife aware.

## 2013-12-11 NOTE — ED Notes (Signed)
Pt reports hx of transient global amnesia, no episode in past 4-5 years. Pt last seen normal at 0515, then pt has no memory of events today and yesterday evening. Family reports this usually occurs with changes and stress, and pt had death in family this am.

## 2013-12-11 NOTE — ED Notes (Signed)
Pt transported to MRI 

## 2013-12-11 NOTE — Consult Note (Addendum)
Neurology Consultation Reason for Consult: Amnesia Referring Physician: Rosalyn Gess  CC: Amnesia  History is obtained from: Patient, wife  HPI: Jerry Campbell is a 63 y.o. male with a history of 3 previous episodes of anterograde amnesia in 2001, 2008 2011. He was in his normal state of health up until this morning at which point his mother died. Following this, he went over to his father's house and while there began having difficulty with his memory. His wife states that he seemed his normal self with the exception of the memory difficulty.   Of note, he does remember going for a CT scan earlier today.  ROS: A 14 point ROS was performed and is negative except as noted in the HPI.   Past Medical History  Diagnosis Date  . DDD (degenerative disc disease)   . Hyperlipidemia     takes Simvastatin daily  . Hyperplastic colon polyp   . Constipation     d/t meds;taking an OTC stool softener daily(for the past 2 wks)  . Bronchitis     hx of-last time being about a yr ago  . Confusion     hx of-2008  . Chronic back pain     herniated disc,lumbago,spondylosis  . History of kidney stones 25+yrs ago  . Anxiety     takes Valium prn  . Transient global amnesia     Family History: No history of seizures  Social History: Tob: Denies  Exam: Current vital signs: BP 155/93 mmHg  Pulse 66  Temp(Src) 98.2 F (36.8 C) (Oral)  Resp 15  Ht 5\' 8"  (1.727 m)  Wt 72.576 kg (160 lb)  BMI 24.33 kg/m2  SpO2 96% Vital signs in last 24 hours: Temp:  [98.2 F (36.8 C)] 98.2 F (36.8 C) (11/20 1529) Pulse Rate:  [66-79] 66 (11/20 1745) Resp:  [12-18] 15 (11/20 1745) BP: (155-183)/(91-120) 155/93 mmHg (11/20 1745) SpO2:  [95 %-100 %] 96 % (11/20 1745) Weight:  [72.576 kg (160 lb)] 72.576 kg (160 lb) (11/20 1529)  General: In bed, NAD CV: Regular rate and rhythm  Physical Exam  Constitutional: He appears well-developed and well-nourished.  Psych: Affect appropriate to situation Eyes:  No scleral injection HENT: No OP obstrucion Head: Normocephalic.  Cardiovascular: Normal rate and regular rhythm.  Respiratory: Effort normal and breath sounds normal.  GI: Soft. Bowel sounds are normal. No distension. There is no tenderness.  Skin: WDI  Neuro: Mental Status: Patient is awake, alert, oriented to person, place, month, year, and situation. Patient is able to recall events from the past, but multiple events from the day he is unable to recall. Patient is able to give a clear and coherent history. No signs of aphasia or neglect Cranial Nerves: II: Visual Fields are full. Pupils are equal, round, and reactive to light.   III,IV, VI: EOMI without ptosis or diploplia.  V: Facial sensation is symmetric to temperature VII: Facial movement is symmetric.  VIII: hearing is intact to voice X: Uvula elevates symmetrically XI: Shoulder shrug is symmetric. XII: tongue is midline without atrophy or fasciculations.  Motor: Tone is normal. Bulk is normal. 5/5 strength was present in all four extremities.  Sensory: Sensation is symmetric to light touch and temperature in the arms and legs. Deep Tendon Reflexes: 2+ and symmetric in the biceps and patellae.  Cerebellar: FNF intact bilaterally     I have reviewed labs in epic and the results pertinent to this consultation are: CMP-unremarkable  I have reviewed the images  obtained: CT head-unremarkable  Impression: 63 year old male with memory difficulty over the course of the day. The semiology is not consistent with seizure, as there are retrograde and anterograde components as well as intact sensorium and cognition otherwise. Likewise I do not think that this would be consistent with stroke.  Really the two diagnoses that I feel are likely candidates would be transient global amnesia vs psychogenic fugue. TGA can occur after stressful life events as happened with him today.   An MRI could be helpful to distinguish transient  global amnesia from psychogenic fugue, though may not be. There can be diffusion changes in the hippocampus on MRI during an episode and therefore think it may be worthwhile to obtain.  Recommendations: 1) MRI brain 2) if negative, and the patient seems to be improving then I think that he could be discharged home.   Roland Rack, MD [facility] [phone]  If 7pm- 7am, please page neurology on call as listed in [place].

## 2013-12-14 ENCOUNTER — Ambulatory Visit (INDEPENDENT_AMBULATORY_CARE_PROVIDER_SITE_OTHER): Payer: BC Managed Care – PPO | Admitting: Internal Medicine

## 2013-12-14 ENCOUNTER — Encounter: Payer: Self-pay | Admitting: Internal Medicine

## 2013-12-14 ENCOUNTER — Telehealth: Payer: Self-pay

## 2013-12-14 VITALS — BP 146/90 | HR 68 | Temp 97.6°F | Ht 68.0 in | Wt 158.0 lb

## 2013-12-14 DIAGNOSIS — Z860100 Personal history of colon polyps, unspecified: Secondary | ICD-10-CM

## 2013-12-14 DIAGNOSIS — Z0000 Encounter for general adult medical examination without abnormal findings: Secondary | ICD-10-CM

## 2013-12-14 DIAGNOSIS — F4321 Adjustment disorder with depressed mood: Secondary | ICD-10-CM

## 2013-12-14 DIAGNOSIS — E785 Hyperlipidemia, unspecified: Secondary | ICD-10-CM

## 2013-12-14 DIAGNOSIS — F4320 Adjustment disorder, unspecified: Secondary | ICD-10-CM

## 2013-12-14 DIAGNOSIS — J309 Allergic rhinitis, unspecified: Secondary | ICD-10-CM

## 2013-12-14 DIAGNOSIS — Z8739 Personal history of other diseases of the musculoskeletal system and connective tissue: Secondary | ICD-10-CM

## 2013-12-14 DIAGNOSIS — G454 Transient global amnesia: Secondary | ICD-10-CM

## 2013-12-14 LAB — POCT URINALYSIS DIPSTICK
Bilirubin, UA: NEGATIVE
Blood, UA: NEGATIVE
Glucose, UA: NEGATIVE
KETONES UA: NEGATIVE
Leukocytes, UA: NEGATIVE
NITRITE UA: NEGATIVE
Protein, UA: NEGATIVE
SPEC GRAV UA: 1.015
Urobilinogen, UA: NEGATIVE
pH, UA: 7.5

## 2013-12-14 MED ORDER — ALPRAZOLAM 0.25 MG PO TABS: 0.2500 mg | ORAL_TABLET | Freq: Two times a day (BID) | ORAL | Status: DC | PRN

## 2013-12-14 MED ORDER — SIMVASTATIN 10 MG PO TABS: 10.0000 mg | ORAL_TABLET | Freq: Every day | ORAL | Status: DC

## 2013-12-14 NOTE — Patient Instructions (Addendum)
Continue same meds and return in one year. Take Xanax sparing during funeral. Follow-up with neurologist.

## 2013-12-14 NOTE — Telephone Encounter (Signed)
Referral for Dr Maureen Chatters faxed to office.

## 2013-12-21 ENCOUNTER — Ambulatory Visit (INDEPENDENT_AMBULATORY_CARE_PROVIDER_SITE_OTHER): Payer: BC Managed Care – PPO | Admitting: Internal Medicine

## 2013-12-21 DIAGNOSIS — Z23 Encounter for immunization: Secondary | ICD-10-CM

## 2013-12-23 ENCOUNTER — Encounter: Payer: Self-pay | Admitting: Neurology

## 2013-12-23 ENCOUNTER — Ambulatory Visit (INDEPENDENT_AMBULATORY_CARE_PROVIDER_SITE_OTHER): Payer: BC Managed Care – PPO | Admitting: Neurology

## 2013-12-23 VITALS — BP 130/83 | HR 62 | Resp 14 | Ht 68.5 in | Wt 159.0 lb

## 2013-12-23 DIAGNOSIS — G454 Transient global amnesia: Secondary | ICD-10-CM | POA: Insufficient documentation

## 2013-12-23 NOTE — Progress Notes (Signed)
Provider:  Larey Seat, M D  Referring Provider: Elby Showers, MD Primary Care Physician:  Elby Showers, MD  Chief Complaint  Patient presents with  . NP Jerry Campbell transient global amnesia    Rm 10, wife    HPI:  Jerry Campbell is a 63 y.o. male , seen here as a referral  from Jerry Campbell for a spell of transient global amnesia.  Jerry Campbell had been established patient of Jerry Campbell in the past and he had episodic confusion prior to today's presentation. In 2001 in late spring in November 2008 and in June 2011  (  While he was in [place] ) he had presented with episodic disorientation.  Now, on November 20 of 2015 he presented upon pressure of his wife to the local ER to be evaluated and was 3 days later  seen by Jerry Campbell, his primary care physician.  Jerry Campbell had seen him also in 2008 after the very same complaint . Jerry Campbell , Neuro-hospitalist at the hospital ED stated the diagnosis of transient global amnesia.   Jerry Campbell has a strong maternal history of alzheimer's dementia. His mother had passed away the morning of the spell , he received a phone call on Friday morning at 3 AM . He drove to the nursing facility to meet his father, went back to his father's house - this is from second hand report as he cannot recall the events and the drive to Jerry Campbell, his visit with his father.  It was his father who called Jerry Campbell and informed her of the amnestic spell, and once he was picked up at his father's house by his wife.  She brought him home, and gave  Him a Xanax, let him sleep and when he woke he was still unable to recall - but felt he was in his sisters childhood room.  She decided to bring him  to ED, where he had a detailed work up.   CT , metabolic panel and EKG were normal, vital signs were in normal range except for high blood pressure . He had a cardiac work up. Normal.  The whole spell lasted longer than the previous ones. He feels they are more "  severe".  He is a fomer night shift Insurance underwriter, worked for [employer], now retired.      Review of Systems: Out of a complete 14 system review, the patient complains of only the following symptoms, and all other reviewed systems are negative. Repeated confusion- amnestic events. Repetitive questions, high BP, and retrognathia.   History   Social History  . Marital Status: Married    Spouse Name: N/A    Number of Children: N/A  . Years of Education: N/A   Occupational History  . Not on file.   Social History Main Topics  . Smoking status: Never Smoker   . Smokeless tobacco: Not on file  . Alcohol Use: Yes     Comment: occasional beer  . Drug Use: No  . Sexual Activity: Yes   Other Topics Concern  . Not on file   Social History Narrative   Right handed.  Caffeine 1 cup avg daily.  Retired.  Married, 1 daughter.  Graduate school.    Family History  Problem Relation Age of Onset  . Cancer Mother   . Heart disease Mother   . Hypertension Mother   . Cancer Father   . Heart disease Brother   .  Diabetes Paternal Grandmother   . Stroke Paternal Grandmother   . Anesthesia problems Neg Hx   . Hypotension Neg Hx   . Malignant hyperthermia Neg Hx   . Pseudochol deficiency Neg Hx     Past Medical History  Diagnosis Date  . DDD (degenerative disc disease)   . Hyperlipidemia     takes Simvastatin daily  . Hyperplastic colon polyp   . Constipation     d/t meds;taking an OTC stool softener daily(for the past 2 wks)  . Bronchitis     hx of-last time being about a yr ago  . Confusion     hx of-2008  . Chronic back pain     herniated disc,lumbago,spondylosis  . History of kidney stones 25+yrs ago  . Anxiety     takes Valium prn  . Transient global amnesia     Past Surgical History  Procedure Laterality Date  . Vasectomy  97yrs ago  . Umbilical hernia repair  10/10/2010  . Wisdom teeth extracted  early 80's  . Colonoscopy    . Lumbar laminectomy/decompression  microdiscectomy  04/09/2011    Procedure: LUMBAR LAMINECTOMY/DECOMPRESSION MICRODISCECTOMY;  Surgeon: Jerry Miss, MD;  Location: [facility];  Service: Neurosurgery;  Laterality: Left;  Left Lumbar five - sacral one Microdiskectomy    Current Outpatient Prescriptions  Medication Sig Dispense Refill  . ALPRAZolam (XANAX) 0.25 MG tablet Take 1 tablet (0.25 mg total) by mouth 2 (two) times daily as needed for anxiety. 30 tablet 0  . aspirin EC 81 MG tablet Take 81 mg by mouth daily after supper.     . Aspirin-Salicylamide-Caffeine (BC HEADACHE POWDER PO) Take 1 packet by mouth daily as needed (pain/headache).    . Cholecalciferol (VITAMIN D3) 5000 UNITS TABS Take 5,000 Units by mouth daily.    . fish oil-omega-3 fatty acids 1000 MG capsule Take 1 g by mouth 2 (two) times daily.     Marland Kitchen MELATONIN PO Take 1 tablet by mouth daily as needed (10mg  tablet (prn sleep)).     . Multiple Vitamins-Minerals (MULTIVITAMIN WITH MINERALS) tablet Take 1 tablet by mouth daily.      . naproxen sodium (ALEVE) 220 MG tablet Take 220 mg by mouth daily as needed (pain).    . simvastatin (ZOCOR) 10 MG tablet Take 1 tablet (10 mg total) by mouth daily after supper. 90 tablet 3   No current facility-administered medications for this visit.    Allergies as of 12/23/2013 - Review Complete 12/23/2013  Allergen Reaction Noted  . Latex Swelling 12/11/2013    Vitals: BP 130/83 mmHg  Pulse 62  Resp 14  Ht 5' 8.5" (1.74 m)  Wt 159 lb (72.122 kg)  BMI 23.82 kg/m2 Last Weight:  Wt Readings from Last 1 Encounters:  12/23/13 159 lb (72.122 kg)   Last Height:   Ht Readings from Last 1 Encounters:  12/23/13 5' 8.5" (1.74 m)    Physical exam:  Campbell: The patient is awake, alert and appears not in acute distress. The patient is well groomed. Head: Normocephalic, atraumatic. Neck is supple. Mallampati 2 , neck circumference:15.5  Cardiovascular:  Regular rate and rhythm  without  murmurs or carotid bruit, and  without distended neck veins. Respiratory: Lungs are clear to auscultation. Skin:  Without evidence of edema, or rash Trunk:  normal posture.  Neurologic exam : The patient is awake and alert, oriented to place and time.  Memory subjective described as intact.  There is a normal attention span &  concentration ability. Speech is fluent without  dysarthria, dysphonia or aphasia. Mood and affect are appropriate.  Cranial nerves: Pupils are equal and briskly reactive to light. Funduscopic exam without evidence of pallor or edema. Extraocular movements  in vertical and horizontal planes intact and without nystagmus.  Visual fields by finger perimetry are intact. Hearing to finger rub intact.  Facial sensation intact to fine touch. Facial motor strength is symmetric and tongue and uvula move midline. Tongue protrusion into either cheek is normal. Shoulder shrug is normal.   Motor exam:   Normal tone ,muscle bulk and symmetric  strength in all extremities.  Sensory:  Fine touch, pinprick and vibration were tested in all extremities. Proprioception was normal.  Coordination: Rapid alternating movements in the fingers/hands were normal. Finger-to-nose maneuver  normal without evidence of ataxia, dysmetria or tremor.  Gait and station: Patient walks without assistive device and is able unassisted to climb up to the exam table. Strength within normal limits. Stance is stable and normal. Tandem gait is unfragmented.  Deep tendon reflexes: in the  upper and lower extremities are symmetric and intact. Babinski maneuver response is  downgoing.   Assessment:  After physical and neurologic examination, review of laboratory studies, imaging, neurophysiology testing and pre-existing records, assessment is that of :  TGA , 4 th episode, provoked by stress and sleep deprivation.   Plan:  Treatment plan and additional workup :  No further work up needed. I would like to keep an eye on his BP, and offered a  rescue medication of xanax .      Asencion Partridge Tashawn Laswell MD 12/23/2013

## 2013-12-23 NOTE — Patient Instructions (Signed)
Transient Global Amnesia °Your exam shows you may have a rare problem that causes temporary amnesia, an inability to remember what has happened in the past several hours or day. Transient global amnesia (TGA) means you cannot remember recent events, even though you may look and act normally. There are no physical problems in TGA; your vision, strength, coordination, and sensations are all normal. TGA occurs most often in older patients, and in patients with high blood pressure. The exact cause of TGA is not known, although it is thought to be due to vascular disease in your brain. °There is usually a complete return to normal memory capacity after an episode is over. About 20-30% of patients with TGA will have more than one episode, and some studies show a slight increased risk for stroke. Although no special treatment is needed, taking up to one adult aspirin daily reduces the risk of having a stroke. You should consider taking aspirin daily if you are not allergic to it. Medical evaluation may require specialized scans to check for stroke or other brain problems, an EEG (brain wave test), or blood tests. °Avoid alcohol or any sedating medicines until you are completely recovered. Call your doctor right away if your memory is not fully recovered after 24 hours, or if you have any other serious problems including: °· Severe headache, nausea, vomiting, fever, or other symptoms of an infection. °· Weakness, numbness, difficulty with movement, or incoordination. °· Blurred or double vision, unusual sleepiness, seizures, or fainting. °Document Released: 02/16/2004 Document Revised: 04/02/2011 Document Reviewed: 01/08/2005 °ExitCare® Patient Information ©2015 [employer]. This information is not intended to replace advice given to you by your health care provider. Make sure you discuss any questions you have with your health care provider. ° °

## 2014-01-30 NOTE — Progress Notes (Signed)
Subjective:    Patient ID: Jerry Campbell, male    DOB: 11/13/1950, 64 y.o.   MRN: [id number]  HPI 64 year old White Male in today for health maintenance exam and evaluation of medical issues. Had another episode of memory loss recently (fourth episode) after he found out his mother had passed away. He was hospitalized briefly. Symptoms were similar to previous episodes. He has appointment see neurologist in the near future. Has had extensive cardiac workup in the past. During this admission he had MRI of the brain which was normal. Was felt to have recurrent transient global amnesia.  He has a history of left shoulder pain status post shoulder fracture 2013. Had neck MRI 2013 showing cervical disc disease with mild impingement multilevel. History of lumbar back surgery for herniated disc at L5-S1 by Dr. Ellene Route in 2013. History of hyperlipidemia treated with Zocor. Takes Pilates with [employer]. History of low back pain.  History of fractured left wrist secondary to a fall in 1995. History of fractured left fifth toe in the 1980s. History of fractured right toe in karate and early 1990s. Fractured left shoulder with possible rotator cuff tear December 2005. Mastectomy 1991. Colonoscopy is up-to-date. History of hyperplastic colon polyps.  In July 2012 he had an allergy consultation for lingering cough. Spirometry revealed a mild restrictive pattern. Skin test showed results were positive to dust mites, cockroach, house dust and mold mix. He was treated with Proventil inhaler.  Had tetanus immunization June 2010.  Patient says he was thought to have a concussion from a fall from a ladder in 1995.  Hospitalized in [place] in June 2011 with an episode of confusion and forgetfulness. CT of the brain was negative. This was the third episode of confusion in his life. In November 2008 he was admitted to [facility] with a similar presentation but had syncope. He had very thorough evaluations by Drs.  Caryl Comes and Love. Symptoms have been known to start after giving a presentation to a group of coworkers.Stress seems to play a role in these episodes.  EEG in 2008 was negative. 2-D echocardiogram in 2008 as well as Cardiolite study were negative. MRI of the brain with and without contrast in August 2011 was normal.  Family history: Mother died recently with complications of dementia. Father with history of MI and prostate cancer as well as hyperlipidemia and glaucoma. Mother had history of glaucoma, hypertension and hypothyroidism. One brother with history of MI in his early 70s. One sister in good health.  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 Wife has multiple sclerosis. She worked as a Equities trader but has retired. Patient has one stepdaughter.  No known drug allergies. Patient says he received Pneumovax immunization at work. Had tetanus immunization in 2010.    Review of Systems  Constitutional: Negative.   HENT: Negative.   Eyes: Negative.   Respiratory: Negative.   Cardiovascular: Negative.   Endocrine: Negative.   Genitourinary: Negative.   Musculoskeletal:       History of low back pain and shoulder pain  Neurological:       Recurrent episodes of memory loss thought to be TGA  Hematological: Negative.   Psychiatric/Behavioral:       Grief reaction from loss of mother       Objective:   Physical Exam  Constitutional: He is oriented to person, place, and time. He appears well-developed and well-nourished.  HENT:  Head: Normocephalic and atraumatic.  Right  Ear: External ear normal.  Left Ear: External ear normal.  Mouth/Throat: Oropharynx is clear and moist. No oropharyngeal exudate.  Eyes: Conjunctivae and EOM are normal. Pupils are equal, round, and reactive to light. Right eye exhibits no discharge. Left eye exhibits no discharge. No scleral icterus.  Neck: Neck supple. No JVD present. No thyromegaly present.  Cardiovascular: Normal  rate, regular rhythm, normal heart sounds and intact distal pulses.   No murmur heard. Pulmonary/Chest: Effort normal and breath sounds normal. No respiratory distress. He has no wheezes. He has no rales.  Abdominal: Soft. Bowel sounds are normal. He exhibits no distension and no mass. There is no tenderness. There is no rebound and no guarding.  Genitourinary: Prostate normal.  Musculoskeletal: Normal range of motion. He exhibits no edema.  Neurological: He is alert and oriented to person, place, and time. He has normal reflexes. No cranial nerve deficit. Coordination normal.  Skin: Skin is warm and dry. No rash noted. He is not diaphoretic.  Psychiatric: He has a normal mood and affect. His behavior is normal. Judgment and thought content normal.  Vitals reviewed.         Assessment & Plan:  Recurrent episodes of memory loss and confusion thought to be TGA with recent episode related to death of mother  Grief reaction secondary to loss of mother-prescribed Xanax for anxiety during funeral  History of low back pain  History of left shoulder pain  Hyperlipidemia-treated with Zocor  History of allergic rhinitis  History of hyperplastic colon polyps  History of herniated disc surgery L5-S1 2013  Plan: Return in one year or as needed. Follow-up with neurologist.

## 2014-03-18 NOTE — Progress Notes (Signed)
   Subjective:    Patient ID: Jerry Campbell, male    DOB: Oct 06, 1950, 64 y.o.   MRN: [id number]  HPI See separate encounter same day    Review of Systems     Objective:   Physical Exam        Assessment & Plan:

## 2014-10-13 ENCOUNTER — Other Ambulatory Visit: Payer: Self-pay | Admitting: Internal Medicine

## 2014-10-19 ENCOUNTER — Encounter: Payer: Self-pay | Admitting: Internal Medicine

## 2014-12-02 ENCOUNTER — Encounter: Payer: Self-pay | Admitting: Gastroenterology

## 2014-12-13 ENCOUNTER — Other Ambulatory Visit: Payer: BLUE CROSS/BLUE SHIELD | Admitting: Internal Medicine

## 2014-12-13 DIAGNOSIS — Z79899 Other long term (current) drug therapy: Secondary | ICD-10-CM

## 2014-12-13 DIAGNOSIS — Z0000 Encounter for general adult medical examination without abnormal findings: Secondary | ICD-10-CM

## 2014-12-13 DIAGNOSIS — E785 Hyperlipidemia, unspecified: Secondary | ICD-10-CM

## 2014-12-13 LAB — CBC WITH DIFFERENTIAL/PLATELET
BASOS ABS: 0 10*3/uL (ref 0.0–0.1)
Basophils Relative: 0 % (ref 0–1)
EOS ABS: 0.1 10*3/uL (ref 0.0–0.7)
EOS PCT: 2 % (ref 0–5)
HCT: 48.8 % (ref 39.0–52.0)
Hemoglobin: 16.3 g/dL (ref 13.0–17.0)
LYMPHS ABS: 1.7 10*3/uL (ref 0.7–4.0)
LYMPHS PCT: 32 % (ref 12–46)
MCH: 31.7 pg (ref 26.0–34.0)
MCHC: 33.4 g/dL (ref 30.0–36.0)
MCV: 94.8 fL (ref 78.0–100.0)
MONO ABS: 0.4 10*3/uL (ref 0.1–1.0)
MPV: 10.4 fL (ref 8.6–12.4)
Monocytes Relative: 7 % (ref 3–12)
Neutro Abs: 3.1 10*3/uL (ref 1.7–7.7)
Neutrophils Relative %: 59 % (ref 43–77)
Platelets: 201 10*3/uL (ref 150–400)
RBC: 5.15 MIL/uL (ref 4.22–5.81)
RDW: 13.7 % (ref 11.5–15.5)
WBC: 5.2 10*3/uL (ref 4.0–10.5)

## 2014-12-13 LAB — LIPID PANEL
CHOL/HDL RATIO: 3.5 ratio (ref <=5.0)
CHOLESTEROL: 156 mg/dL (ref 125–200)
HDL: 45 mg/dL (ref >=40)
LDL Cholesterol: 89 mg/dL (ref <130)
Triglycerides: 112 mg/dL (ref <150)
VLDL: 22 mg/dL (ref <30)

## 2014-12-13 LAB — COMPLETE METABOLIC PANEL WITH GFR
ALT: 20 U/L (ref 9–46)
AST: 18 U/L (ref 10–35)
Albumin: 4.4 g/dL (ref 3.6–5.1)
Alkaline Phosphatase: 45 U/L (ref 40–115)
BUN: 20 mg/dL (ref 7–25)
CALCIUM: 9.8 mg/dL (ref 8.6–10.3)
CHLORIDE: 105 mmol/L (ref 98–110)
CO2: 28 mmol/L (ref 20–31)
CREATININE: 1.21 mg/dL (ref 0.70–1.25)
GFR, EST AFRICAN AMERICAN: 74 mL/min (ref >=60)
GFR, EST NON AFRICAN AMERICAN: 64 mL/min (ref >=60)
Glucose, Bld: 90 mg/dL (ref 65–99)
Potassium: 4.8 mmol/L (ref 3.5–5.3)
Sodium: 141 mmol/L (ref 135–146)
Total Bilirubin: 1 mg/dL (ref 0.2–1.2)
Total Protein: 6.6 g/dL (ref 6.1–8.1)

## 2014-12-14 LAB — PSA: PSA: 0.9 ng/mL (ref <=4.00)

## 2014-12-20 ENCOUNTER — Encounter: Payer: Self-pay | Admitting: Internal Medicine

## 2014-12-20 ENCOUNTER — Ambulatory Visit (INDEPENDENT_AMBULATORY_CARE_PROVIDER_SITE_OTHER): Payer: BLUE CROSS/BLUE SHIELD | Admitting: Internal Medicine

## 2014-12-20 VITALS — BP 130/92 | HR 82 | Temp 98.1°F | Resp 18 | Ht 69.0 in | Wt 166.0 lb

## 2014-12-20 DIAGNOSIS — E785 Hyperlipidemia, unspecified: Secondary | ICD-10-CM

## 2014-12-20 DIAGNOSIS — Z0000 Encounter for general adult medical examination without abnormal findings: Secondary | ICD-10-CM | POA: Diagnosis not present

## 2014-12-20 DIAGNOSIS — J309 Allergic rhinitis, unspecified: Secondary | ICD-10-CM

## 2014-12-20 DIAGNOSIS — Z23 Encounter for immunization: Secondary | ICD-10-CM

## 2014-12-20 DIAGNOSIS — M545 Low back pain, unspecified: Secondary | ICD-10-CM

## 2014-12-20 DIAGNOSIS — G454 Transient global amnesia: Secondary | ICD-10-CM

## 2014-12-20 DIAGNOSIS — Z86010 Personal history of colonic polyps: Secondary | ICD-10-CM

## 2014-12-20 DIAGNOSIS — M25512 Pain in left shoulder: Secondary | ICD-10-CM | POA: Diagnosis not present

## 2014-12-20 LAB — POCT URINALYSIS DIPSTICK
BILIRUBIN UA: NEGATIVE
Blood, UA: NEGATIVE
GLUCOSE UA: NEGATIVE
Ketones, UA: NEGATIVE
LEUKOCYTES UA: NEGATIVE
NITRITE UA: NEGATIVE
Protein, UA: NEGATIVE
Spec Grav, UA: 1.02
Urobilinogen, UA: 0.2
pH, UA: 6

## 2014-12-20 NOTE — Patient Instructions (Addendum)
Continue Zocor. Keep eye on blood pressure at home. Call if persistently elevated. RTC in one year. Flu vaccine given

## 2014-12-20 NOTE — Progress Notes (Signed)
Subjective:    Patient ID: Jerry Campbell, male    DOB: 1950-12-01, 64 y.o.   MRN: [id number]  HPI 64 year old White Male in today for health maintenance exam and evaluation of medical issues. He has a history of multiple episodes (4)  of transient global amnesia evaluated extensively by cardiology and neurology. The last one occurred last year when his mother passed away. He was hospitalized briefly in 2015 and had a negative MRI of the brain.  History of left shoulder pain status post shoulder fracture in 2013. Says shoulder is in pretty good shape at this point in time. Had neck MRI in 2013 showing cervical disc disease with mild impingement multilevel. History of lumbar back surgery for herniated disc L5-S1 by Dr. Ellene Route in 2013. History of hyperlipidemia treated with Zocor. He takes Pilates with [employer]. History of low back pain.  History of fractured left wrist secondary to a fall in 1995. History of fractured left fifth toe in the 1980s. History of fractured right toe in karate in the early 1990s. Fractured left shoulder with possible rotator cuff tear December 2005. Vasectomy 1991. Colonoscopy is up-to-date. History of hyperplastic colon polyps.  In July 2012 he had an allergy consultation for lingering cough. Spirometry showed mild restrictive pattern. Skin test were positive for dust mites, cockroach, house dust and mold mix. He was treated with Proventil inhaler.  Tetanus immunization done June 2010.  Patient says he was thought to have suffered a concussion from a fall from a latter in 1995.  Hospitalized in [place] in June 2011 with an episode of confusion and forgetfulness. CT of the brain was negative. This was the third episode of confusion in his life. In November 2008 he was admitted, hospital with a similar presentation but had syncope. He had a very thorough evaluation by Dr. Caryl Comes and Dr. Erling Cruz. Symptoms have been known to start after giving a presentation to a group of  coworkers. Stress seems to play a role in these episodes. EEG in 2008 was negative. 2-D echocardiogram in 2008 as well as Cardiolite study were both negative. MRI of the brain with and without contrast in August 2011 was normal.  Family history: Mother died with complications of dementia. She also had glaucoma hypertension and hypothyroidism. One brother with history of MI in his early 36s. One sister in good health. Father with history of MI, prostate cancer, hyperlipidemia and glaucoma.  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 No children with this marriage. Wife has multiple sclerosis. Wife had daughter from previous marriage. Patient's wife formerly worked as a Equities trader but has retired.  No known drug allergies. Patient says he received Pneumovax immunization at work although I'm not sure why he received this.    Review of Systems  Constitutional: Negative.   HENT: Negative.   Eyes: Negative.   Respiratory: Negative.   Endocrine:       History of hyperlipidemia treated with statin  Musculoskeletal:       Left shoulder and right knee pain from time to time  Skin: Negative.   Allergic/Immunologic: Positive for environmental allergies.  Neurological:       4 episodes of transient global amnesia the last being in 2015  Hematological: Negative.   Psychiatric/Behavioral: Negative.        Objective:   Physical Exam  Constitutional: He is oriented to person, place, and time. He appears well-developed and well-nourished. No distress.  HENT:  Head: Normocephalic  and atraumatic.  Right Ear: External ear normal.  Left Ear: External ear normal.  Mouth/Throat: Oropharynx is clear and moist. No oropharyngeal exudate.  Eyes: Conjunctivae are normal. Pupils are equal, round, and reactive to light. Right eye exhibits no discharge. Left eye exhibits no discharge. No scleral icterus.  Neck: Neck supple. No JVD present. No thyromegaly present.    Cardiovascular: Normal rate, regular rhythm and normal heart sounds.   No murmur heard. Pulmonary/Chest: Effort normal and breath sounds normal. No respiratory distress. He has no wheezes. He has no rales.  Abdominal: Soft. Bowel sounds are normal. He exhibits no distension and no mass. There is no rebound and no guarding.  Genitourinary: Prostate normal.  Musculoskeletal: He exhibits no edema.  Lymphadenopathy:    He has no cervical adenopathy.  Neurological: He is alert and oriented to person, place, and time. He has normal reflexes. No cranial nerve deficit.  Skin: Skin is warm and dry. No rash noted. He is not diaphoretic.  Psychiatric: He has a normal mood and affect. His behavior is normal. Judgment and thought content normal.  Vitals reviewed.         Assessment & Plan:  Repeated episodes of transient global amnesia none within the past year  Hyperlipidemia-continue Zocor  Mildly elevated diastolic pressure-keep record of blood pressure at home and call if persistently elevated  History of low back pain  History of left shoulder pain  History of allergic rhinitis  History of hyperplastic colon polyps  History of herniated disc surgery L5-S1 2013  Plan: Continue same medications. Lab work reviewed and is within normal limits. Flu vaccine given. Return in one year or as needed.

## 2014-12-30 ENCOUNTER — Encounter: Payer: Self-pay | Admitting: Gastroenterology

## 2015-02-16 ENCOUNTER — Ambulatory Visit (AMBULATORY_SURGERY_CENTER): Payer: Self-pay | Admitting: *Deleted

## 2015-02-16 VITALS — Ht 68.0 in | Wt 165.0 lb

## 2015-02-16 DIAGNOSIS — Z86010 Personal history of colonic polyps: Secondary | ICD-10-CM

## 2015-02-16 MED ORDER — NA SULFATE-K SULFATE-MG SULF 17.5-3.13-1.6 GM/177ML PO SOLN: ORAL | Status: DC

## 2015-02-16 NOTE — Progress Notes (Signed)
Patient denies any allergies to eggs or soy. Patient denies any problems with anesthesia/sedation. Patient denies any oxygen use at home and does not take any diet/weight loss medications.  

## 2015-03-02 ENCOUNTER — Encounter: Payer: Self-pay | Admitting: Gastroenterology

## 2015-03-02 ENCOUNTER — Ambulatory Visit (AMBULATORY_SURGERY_CENTER): Payer: BLUE CROSS/BLUE SHIELD | Admitting: Gastroenterology

## 2015-03-02 VITALS — BP 94/64 | HR 61 | Temp 96.8°F | Resp 16 | Ht 68.0 in | Wt 165.0 lb

## 2015-03-02 DIAGNOSIS — D120 Benign neoplasm of cecum: Secondary | ICD-10-CM

## 2015-03-02 DIAGNOSIS — Z86010 Personal history of colonic polyps: Secondary | ICD-10-CM

## 2015-03-02 DIAGNOSIS — D123 Benign neoplasm of transverse colon: Secondary | ICD-10-CM | POA: Diagnosis not present

## 2015-03-02 MED ORDER — SODIUM CHLORIDE 0.9 % IV SOLN: 500.0000 mL | INTRAVENOUS | Status: DC

## 2015-03-02 NOTE — Op Note (Signed)
[facility]  [address] [place], [id number]   COLONOSCOPY PROCEDURE REPORT  PATIENT: Jerry Campbell, Jerry Campbell  MR#: [id number] BIRTHDATE: [birth year] , 25  yrs. old GENDER: male ENDOSCOPIST: Yetta Flock, MD REFERRED BY: Tedra Senegal MD PROCEDURE DATE:  03/02/2015 PROCEDURE:   Colonoscopy, surveillance , Colonoscopy with biopsy, and Colonoscopy with snare polypectomy First Screening Colonoscopy - Avg.  risk and is 50 yrs.  old or older - No.  Prior Negative Screening - Now for repeat screening. N/A  History of Adenoma - Now for follow-up colonoscopy & has been > or = to 3 yrs.  Yes hx of adenoma.  Has been 3 or more years since last colonoscopy.  Polyps removed today? Yes ASA CLASS:   Class II INDICATIONS:Surveillance due to prior colonic neoplasia and Colorectal Neoplasm Risk Assessment for this procedure is average risk. MEDICATIONS: Propofol 200 mg IV  DESCRIPTION OF PROCEDURE:   After the risks benefits and alternatives of the procedure were thoroughly explained, informed consent was obtained.  The digital rectal exam revealed no abnormalities of the rectum.   The [id number]  endoscope was introduced through the anus and advanced to the cecum, which was identified by both the appendix and ileocecal valve. No adverse events experienced.   The quality of the prep was adequate  The instrument was then slowly withdrawn as the colon was fully examined. Estimated blood loss is zero unless otherwise noted in this procedure report.   COLON FINDINGS: A 65mm sessile polyp was noted in the cecum and removed with cold forceps.  A 40mm sessile polyp was noted in the transverse colon and removed with cold snare.  Mild diverticulosis was noted in the sigmoid colon.  The remainder of the examined colon was normal.  Retroflexed views revealed small internal hemorrhoids. The time to cecum = 1.5 Withdrawal time = 12.1   The scope was withdrawn and the procedure  completed. COMPLICATIONS: There were no immediate complications.  ENDOSCOPIC IMPRESSION: Two small colon polyps removed Mild diverticulosis Small internal hemorrhoids  RECOMMENDATIONS: Await pathology results Resume diet Resume medications NO NSAIDs for 2 weeks  eSigned:  Yetta Flock, MD 03/02/2015 11:18 AM   cc:  Tedra Senegal MD, the patient

## 2015-03-02 NOTE — Progress Notes (Signed)
Report to PACU, RN, vss, BBS= Clear.  

## 2015-03-02 NOTE — Patient Instructions (Signed)
YOU HAD AN ENDOSCOPIC PROCEDURE TODAY AT [facility]:   Refer to the procedure report that was given to you for any specific questions about what was found during the examination.  If the procedure report does not answer your questions, please call your gastroenterologist to clarify.  If you requested that your care partner not be given the details of your procedure findings, then the procedure report has been included in a sealed envelope for you to review at your convenience later.  YOU SHOULD EXPECT: Some feelings of bloating in the abdomen. Passage of more gas than usual.  Walking can help get rid of the air that was put into your GI tract during the procedure and reduce the bloating. If you had a lower endoscopy (such as a colonoscopy or flexible sigmoidoscopy) you may notice spotting of blood in your stool or on the toilet paper. If you underwent a bowel prep for your procedure, you may not have a normal bowel movement for a few days.  Please Note:  You might notice some irritation and congestion in your nose or some drainage.  This is from the oxygen used during your procedure.  There is no need for concern and it should clear up in a day or so.  SYMPTOMS TO REPORT IMMEDIATELY:   Following lower endoscopy (colonoscopy or flexible sigmoidoscopy):  Excessive amounts of blood in the stool  Significant tenderness or worsening of abdominal pains  Swelling of the abdomen that is new, acute  Fever of 100F or higher   For urgent or emergent issues, a gastroenterologist can be reached at any hour by calling [phone].   DIET: Your first meal following the procedure should be a small meal and then it is ok to progress to your normal diet. Heavy or fried foods are harder to digest and may make you feel nauseous or bloated.  Likewise, meals heavy in dairy and vegetables can increase bloating.  Drink plenty of fluids but you should avoid alcoholic beverages for 24  hours.  ACTIVITY:  You should plan to take it easy for the rest of today and you should NOT DRIVE or use heavy machinery until tomorrow (because of the sedation medicines used during the test).    FOLLOW UP: Our staff will call the number listed on your records the next business day following your procedure to check on you and address any questions or concerns that you may have regarding the information given to you following your procedure. If we do not reach you, we will leave a message.  However, if you are feeling well and you are not experiencing any problems, there is no need to return our call.  We will assume that you have returned to your regular daily activities without incident.  If any biopsies were taken you will be contacted by phone or by letter within the next 1-3 weeks.  Please call us at [phone] if you have not heard about the biopsies in 3 weeks.    SIGNATURES/CONFIDENTIALITY: You and/or your care partner have signed paperwork which will be entered into your electronic medical record.  These signatures attest to the fact that that the information above on your After Visit Summary has been reviewed and is understood.  Full responsibility of the confidentiality of this discharge information lies with you and/or your care-partner.  Await pathology Resume regular medication and diet No NSAIDS for 2 weeks Polyp/ Hemorrhoid handout given

## 2015-03-02 NOTE — Progress Notes (Signed)
Called to room to assist during endoscopic procedure.  Patient ID and intended procedure confirmed with present staff. Received instructions for my participation in the procedure from the performing physician.  

## 2015-03-03 ENCOUNTER — Telehealth: Payer: Self-pay

## 2015-03-03 NOTE — Telephone Encounter (Signed)
  Follow up Call-  Call back number 03/02/2015  Post procedure Call Back phone  # [phone]- home  Permission to leave phone message Yes     Patient questions:  Do you have a fever, pain , or abdominal swelling? No. Pain Score  0 *  Have you tolerated food without any problems? Yes.    Have you been able to return to your normal activities? Yes.    Do you have any questions about your discharge instructions: Diet   No. Medications  No. Follow up visit  No.  Do you have questions or concerns about your Care? No.  Actions: * If pain score is 4 or above: No action needed, pain <4.

## 2015-03-08 ENCOUNTER — Encounter: Payer: Self-pay | Admitting: Gastroenterology

## 2015-12-19 ENCOUNTER — Other Ambulatory Visit: Payer: BLUE CROSS/BLUE SHIELD | Admitting: Internal Medicine

## 2015-12-19 DIAGNOSIS — Z125 Encounter for screening for malignant neoplasm of prostate: Secondary | ICD-10-CM

## 2015-12-19 DIAGNOSIS — Z130 Encounter for screening for diseases of the blood and blood-forming organs and certain disorders involving the immune mechanism: Secondary | ICD-10-CM

## 2015-12-19 DIAGNOSIS — E785 Hyperlipidemia, unspecified: Secondary | ICD-10-CM

## 2015-12-19 DIAGNOSIS — Z0000 Encounter for general adult medical examination without abnormal findings: Secondary | ICD-10-CM

## 2015-12-19 LAB — COMPLETE METABOLIC PANEL WITH GFR
ALBUMIN: 4.3 g/dL (ref 3.6–5.1)
ALK PHOS: 40 U/L (ref 40–115)
ALT: 19 U/L (ref 9–46)
AST: 19 U/L (ref 10–35)
BUN: 14 mg/dL (ref 7–25)
CALCIUM: 9.2 mg/dL (ref 8.6–10.3)
CHLORIDE: 107 mmol/L (ref 98–110)
CO2: 28 mmol/L (ref 20–31)
Creat: 1.15 mg/dL (ref 0.70–1.25)
GFR, EST NON AFRICAN AMERICAN: 67 mL/min (ref >=60)
GFR, Est African American: 78 mL/min (ref >=60)
Glucose, Bld: 84 mg/dL (ref 65–99)
POTASSIUM: 4.3 mmol/L (ref 3.5–5.3)
Sodium: 142 mmol/L (ref 135–146)
Total Bilirubin: 0.8 mg/dL (ref 0.2–1.2)
Total Protein: 6.4 g/dL (ref 6.1–8.1)

## 2015-12-19 LAB — LIPID PANEL
CHOL/HDL RATIO: 2.9 ratio (ref <5.0)
CHOLESTEROL: 136 mg/dL (ref <200)
HDL: 47 mg/dL (ref >40)
LDL Cholesterol: 72 mg/dL (ref <100)
TRIGLYCERIDES: 84 mg/dL (ref <150)
VLDL: 17 mg/dL (ref <30)

## 2015-12-19 LAB — CBC WITH DIFFERENTIAL/PLATELET
BASOS ABS: 53 {cells}/uL (ref 0–200)
Basophils Relative: 1 %
EOS PCT: 3 %
Eosinophils Absolute: 159 cells/uL (ref 15–500)
HEMATOCRIT: 46.7 % (ref 38.5–50.0)
HEMOGLOBIN: 15.6 g/dL (ref 13.2–17.1)
LYMPHS ABS: 1802 {cells}/uL (ref 850–3900)
LYMPHS PCT: 34 %
MCH: 31.7 pg (ref 27.0–33.0)
MCHC: 33.4 g/dL (ref 32.0–36.0)
MCV: 94.9 fL (ref 80.0–100.0)
MONO ABS: 318 {cells}/uL (ref 200–950)
MPV: 10.6 fL (ref 7.5–12.5)
Monocytes Relative: 6 %
NEUTROS PCT: 56 %
Neutro Abs: 2968 cells/uL (ref 1500–7800)
Platelets: 215 10*3/uL (ref 140–400)
RBC: 4.92 MIL/uL (ref 4.20–5.80)
RDW: 13.8 % (ref 11.0–15.0)
WBC: 5.3 10*3/uL (ref 3.8–10.8)

## 2015-12-20 LAB — PSA: PSA: 0.7 ng/mL (ref <=4.0)

## 2015-12-22 ENCOUNTER — Ambulatory Visit (INDEPENDENT_AMBULATORY_CARE_PROVIDER_SITE_OTHER): Payer: BLUE CROSS/BLUE SHIELD | Admitting: Internal Medicine

## 2015-12-22 ENCOUNTER — Encounter: Payer: Self-pay | Admitting: Internal Medicine

## 2015-12-22 VITALS — BP 150/100 | HR 92 | Temp 97.7°F | Ht 68.0 in | Wt 165.0 lb

## 2015-12-22 DIAGNOSIS — E7849 Other hyperlipidemia: Secondary | ICD-10-CM

## 2015-12-22 DIAGNOSIS — Z23 Encounter for immunization: Secondary | ICD-10-CM | POA: Diagnosis not present

## 2015-12-22 DIAGNOSIS — G454 Transient global amnesia: Secondary | ICD-10-CM | POA: Diagnosis not present

## 2015-12-22 DIAGNOSIS — Z86010 Personal history of colonic polyps: Secondary | ICD-10-CM

## 2015-12-22 DIAGNOSIS — Z0000 Encounter for general adult medical examination without abnormal findings: Secondary | ICD-10-CM

## 2015-12-22 DIAGNOSIS — E784 Other hyperlipidemia: Secondary | ICD-10-CM

## 2015-12-22 LAB — POCT URINALYSIS DIPSTICK
Bilirubin, UA: NEGATIVE
GLUCOSE UA: NEGATIVE
Ketones, UA: NEGATIVE
Leukocytes, UA: NEGATIVE
NITRITE UA: NEGATIVE
PH UA: 6.5
PROTEIN UA: NEGATIVE
RBC UA: NEGATIVE
SPEC GRAV UA: 1.01
UROBILINOGEN UA: NEGATIVE

## 2015-12-22 NOTE — Progress Notes (Signed)
Subjective:    Patient ID: Jerry Campbell, male    DOB: [birth year], 65 y.o.   MRN: [id number]  HPI  65 year old Male for Health maintenance exam and evaluation of medical issues. He has a history of hyperlipidemia treated with Zocor. He has had a total of 4 episodes of transient global amnesia evaluated extensively by cardiology and neurology. The last one occurred in 2013/05/05 when his mother passed away. He was hospitalized briefly and had a negative MRI of the brain.  History of left shoulder pain status post shoulder fracture in 05-06-2011. Had neck MRI in May 06, 2011 showing cervical disc disease with mild impingement multilevel. History of lumbar back surgery for herniated disc L5-S1 by Dr. Ellene Route in 2011-05-06. He takes Pilates with Leatrice Jewels. History of low back pain but back seems to be in pretty good shape at this point.  History of fractured left wrist secondary to a fall in 05-05-1993. History of fractured left fifth toe in the 1980s. History of fractured right toe in karate and early 1990s. Fractured left shoulder with possible rotator cuff tear December 2005. Vasectomy 1991. Colonoscopy is up-to-date. History of hyperplastic colon polyps.  In July 2012 he had an allergy consultation for lingering cough. Spirometry showed mild restrictive pattern. Skin test were positive for dust mites, cockroach, house dust and mold mix. He was treated with Proventil inhaler.  Tetanus immunization done June 2010.  Patient says he was thought to have suffered a concussion from a fall from a ladder in 05-05-1993.  Hospitalized in [place] in June 2011 with an episode of confusion and forgetfulness. CT of the brain was negative. This was the third episode of confusion in his life. In November 2008 he was admitted to the hospital with a similar presentation but had syncope. He had a very thorough evaluation by Dr. Cleda Mccreedy and Dr. love. Symptoms have been known to start after giving a presentation to a group of coworkers. Stress seems to play  a role in these episodes. EEG in 05/06/2006 was negative. 2-D echocardiogram in 2006/05/06 as well as Cardiolite study were both negative. MRI of the brain with and without contrast in August 2011 was normal.  Family history: Mother died with complications of dementia. She also had glaucoma, hypertension, hypothyroidism. One brother with history of MI in his early 39s. One sister in good health. Father with history of MI, prostate cancer, hyperlipidemia and glaucoma.  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 He is married. No children with this marriage. Wife has multiple sclerosis. Wife had daughter from previous marriage. Patient's wife formally worked as a Equities trader but has retired.  No known drug allergies.  Patient says he received pneumococcal immunization through work although I'm not sure why he received it prior to age 89.    Review of Systems  Constitutional: Negative.   All other systems reviewed and are negative.      Objective:   Physical Exam  Constitutional: He is oriented to person, place, and time. He appears well-developed and well-nourished. No distress.  HENT:  Head: Normocephalic and atraumatic.  Right Ear: External ear normal.  Left Ear: External ear normal.  Mouth/Throat: Oropharynx is clear and moist. No oropharyngeal exudate.  Eyes: Conjunctivae and EOM are normal. Pupils are equal, round, and reactive to light. Right eye exhibits no discharge. Left eye exhibits no discharge. No scleral icterus.  Neck: Neck supple. No JVD present. No thyromegaly present.  Cardiovascular: Normal rate,  regular rhythm, normal heart sounds and intact distal pulses.   No murmur heard. Pulmonary/Chest: Effort normal and breath sounds normal. No respiratory distress. He has no wheezes. He has no rales.  Abdominal: Soft. Bowel sounds are normal. He exhibits no distension and no mass. There is no tenderness. There is no rebound and no guarding.    Genitourinary: Prostate normal.  Musculoskeletal: He exhibits no edema.  Lymphadenopathy:    He has no cervical adenopathy.  Neurological: He is alert and oriented to person, place, and time. He has normal reflexes. No cranial nerve deficit. Coordination normal.  Skin: Skin is warm and dry. No rash noted. He is not diaphoretic.  Psychiatric: He has a normal mood and affect. His behavior is normal. Judgment and thought content normal.  Vitals reviewed.         Assessment & Plan:  Repeated episodes of transient global amnesia but none since 2015  Hyperlipidemia-continue Zocor  History of low back pain  History of left shoulder pain  History of allergic rhinitis  History of hyperplastic colon polyps  History of herniated disc surgery L5-S1 2013  Plan: Continue statin medication and aspirin. Return in one year or as needed.

## 2016-01-11 ENCOUNTER — Other Ambulatory Visit: Payer: Self-pay | Admitting: Internal Medicine

## 2016-01-21 NOTE — Patient Instructions (Signed)
It was a pleasure to see you today. Continue same medications and return in one year.

## 2016-03-10 ENCOUNTER — Ambulatory Visit (INDEPENDENT_AMBULATORY_CARE_PROVIDER_SITE_OTHER): Payer: BLUE CROSS/BLUE SHIELD

## 2016-03-10 ENCOUNTER — Encounter (HOSPITAL_COMMUNITY): Payer: Self-pay | Admitting: Emergency Medicine

## 2016-03-10 ENCOUNTER — Ambulatory Visit (HOSPITAL_COMMUNITY)
Admission: EM | Admit: 2016-03-10 | Discharge: 2016-03-10 | Disposition: A | Discharge status: Home / Self Care | Payer: BLUE CROSS/BLUE SHIELD | Attending: Family Medicine | Admitting: Family Medicine

## 2016-03-10 DIAGNOSIS — S8991XA Unspecified injury of right lower leg, initial encounter: Secondary | ICD-10-CM

## 2016-03-10 NOTE — Discharge Instructions (Signed)
Use the knee brace that you have at home Take ibuprofen as needed for pain See orthopedic doctor IF pain doesn't improve.  Continue with Ice therapy as well.

## 2016-03-10 NOTE — ED Triage Notes (Signed)
PT fell from a ladder approximately 12 feet high. PT reports a pop and pain in right knee. PT denies head injury or LOC.

## 2016-03-10 NOTE — ED Provider Notes (Signed)
CSN: [id number]     Arrival date & time 03/10/16  1458 History   First MD Initiated Contact with Patient 03/10/16 1641     Chief Complaint  Patient presents with  . Fall  . Knee Injury   (Consider location/radiation/quality/duration/timing/severity/associated sxs/prior Treatment) Patient is a well-appearing 66 y.o. Male, fell off a ladder earlier today, is here for knee pain post injury. Reports it was a 16-ft extended ladder and he was just a few feets away from the top so he estimated that he probably fell off 12 inches off the ladder. He reports that the incident happened so quick that he wasn't sure if he injured his knee, but when he attempted to get up, he felt like his R knee didn't feel stable to walk on so he crawled back to the house and haven't put weight on the knee since. Patient denies knee pain at rest and pain is only present with certain movement, which he is not able to specify.   He denies LOC, head or skin injury. He denies headache, dizziness, CP, SOB, visual disturbances, abdominal pain, Nausea or vomiting.        Past Medical History:  Diagnosis Date  . Anxiety    takes Valium prn  . Bronchitis    hx of-last time being about a yr ago  . Chronic back pain    herniated disc,lumbago,spondylosis  . Confusion    hx of-2008  . Constipation    d/t meds;taking an OTC stool softener daily(for the past 2 wks)  . DDD (degenerative disc disease)   . History of kidney stones 25+yrs ago  . Hyperlipidemia    takes Simvastatin daily  . Hyperplastic colon polyp   . Transient global amnesia    Past Surgical History:  Procedure Laterality Date  . COLONOSCOPY    . LUMBAR LAMINECTOMY/DECOMPRESSION MICRODISCECTOMY  04/09/2011   Procedure: LUMBAR LAMINECTOMY/DECOMPRESSION MICRODISCECTOMY;  Surgeon: Kristeen Miss, MD;  Location: [facility];  Service: Neurosurgery;  Laterality: Left;  Left Lumbar five - sacral one Microdiskectomy  . UMBILICAL HERNIA REPAIR  10/10/2010  .  VASECTOMY  67yrs ago  . wisdom teeth extracted  early 68's   Family History  Problem Relation Age of Onset  . Heart disease Mother   . Hypertension Mother   . Breast cancer Mother   . Prostate cancer Father   . Heart disease Brother   . Diabetes Paternal Grandmother   . Stroke Paternal Grandmother   . Colon cancer Paternal Uncle   . Anesthesia problems Neg Hx   . Hypotension Neg Hx   . Malignant hyperthermia Neg Hx   . Pseudochol deficiency Neg Hx   . Esophageal cancer Neg Hx   . Stomach cancer Neg Hx   . Rectal cancer Neg Hx    Social History  Substance Use Topics  . Smoking status: Never Smoker  . Smokeless tobacco: Never Used  . Alcohol use 1.2 oz/week    2 Cans of beer per week     Comment: 3 per week    Review of Systems  Constitutional:       As stated in the HPI    Allergies  Latex  Home Medications   Prior to Admission medications   Medication Sig Start Date End Date Taking? Authorizing Provider  aspirin EC 81 MG tablet Take 81 mg by mouth daily after supper.     Historical Provider, MD  Aspirin-Salicylamide-Caffeine (BC HEADACHE POWDER PO) Take 1 packet by mouth daily as  needed (pain/headache).    Historical Provider, MD  Cholecalciferol (VITAMIN D3) 5000 UNITS TABS Take 5,000 Units by mouth daily.    Historical Provider, MD  fish oil-omega-3 fatty acids 1000 MG capsule Take 1 g by mouth 2 (two) times daily.     Historical Provider, MD  MELATONIN PO Take 1 tablet by mouth daily as needed (10mg  tablet (prn sleep)). Reported on 02/16/2015    Historical Provider, MD  Multiple Vitamins-Minerals (HAIR/SKIN/NAILS PO) Take 1 tablet by mouth daily.    Historical Provider, MD  Multiple Vitamins-Minerals (MULTIVITAMIN WITH MINERALS) tablet Take 1 tablet by mouth daily.      Historical Provider, MD  naproxen sodium (ALEVE) 220 MG tablet Take 220 mg by mouth daily as needed (pain). Reported on 02/16/2015    Historical Provider, MD  simvastatin (ZOCOR) 10 MG tablet TAKE 1  TABLET BY MOUTH EVERY DAY AFTER SUPPER 01/11/16   Elby Showers, MD   Meds Ordered and Administered this Visit  Medications - No data to display  BP 148/96   Pulse 63   Temp 98.4 F (36.9 C) (Oral)   Resp 16   Ht 5\' 8"  (1.727 m)   Wt 160 lb (72.6 kg)   SpO2 97%   BMI 24.33 kg/m  No data found.   Physical Exam  Constitutional: He is oriented to person, place, and time. He appears well-developed and well-nourished.  HENT:  Head: Normocephalic and atraumatic.  Cardiovascular: Normal rate.   Pulmonary/Chest: Effort normal.  Musculoskeletal:  Right knee has full ROM. No swelling noted. No deformity noted. No pain on palpation. Appears symmetrical compared to right. Patient able to bear little weight on R feet but seems appeared of putting weight on the foot instead of not able to.   Neurological: He is alert and oriented to person, place, and time.  Skin: Skin is warm and dry.  Nursing note and vitals reviewed.   Urgent Care Course     Procedures (including critical care time)  Labs Review Labs Reviewed - No data to display  Imaging Review Dg Knee Complete 4 Views Right  Result Date: 03/10/2016 CLINICAL DATA:  [place] from a ladder while pruning trees, felt a pop in RIGHT knee, cannot stand up, cannot bear weight EXAM: RIGHT KNEE - COMPLETE 4+ VIEW COMPARISON:  None FINDINGS: Mild osseous demineralization. Joint spaces preserved. No acute fracture, dislocation, or bone destruction. No definite knee joint effusion. IMPRESSION: No acute osseous abnormalities. Electronically Signed   By: Lavonia Dana M.D.   On: 03/10/2016 16:43    MDM   1. Injury of right knee, initial encounter    Physical exam fairly normal. Xray unremarkable but cannot rule out ligament injury. Most likely a sprain or strain. Will tx conservatively with ICE, NSAID and knee brace (which he has at home). Informed to f/u with Orthopedic specialist if knee pain does not resolve. Return as needed. Patient denies  any questions.    Barry Dienes, NP 03/10/16 1700

## 2016-09-19 ENCOUNTER — Encounter: Payer: Self-pay | Admitting: Neurology

## 2016-12-03 ENCOUNTER — Ambulatory Visit (INDEPENDENT_AMBULATORY_CARE_PROVIDER_SITE_OTHER): Payer: BLUE CROSS/BLUE SHIELD | Admitting: Internal Medicine

## 2016-12-03 DIAGNOSIS — Z23 Encounter for immunization: Secondary | ICD-10-CM | POA: Diagnosis not present

## 2016-12-03 NOTE — Progress Notes (Signed)
Flu shot given

## 2016-12-03 NOTE — Patient Instructions (Signed)
Flu shot given

## 2017-01-03 ENCOUNTER — Other Ambulatory Visit: Payer: Self-pay | Admitting: Internal Medicine

## 2017-01-08 ENCOUNTER — Other Ambulatory Visit: Payer: Self-pay | Admitting: Internal Medicine

## 2017-01-12 ENCOUNTER — Other Ambulatory Visit: Payer: Self-pay | Admitting: Internal Medicine

## 2017-01-17 ENCOUNTER — Other Ambulatory Visit: Payer: Self-pay

## 2017-01-17 MED ORDER — SIMVASTATIN 10 MG PO TABS: ORAL_TABLET | ORAL | 3 refills | Status: DC

## 2017-01-29 ENCOUNTER — Other Ambulatory Visit: Payer: Self-pay | Admitting: Internal Medicine

## 2017-01-29 DIAGNOSIS — Z0000 Encounter for general adult medical examination without abnormal findings: Secondary | ICD-10-CM

## 2017-01-29 DIAGNOSIS — Z13220 Encounter for screening for lipoid disorders: Secondary | ICD-10-CM

## 2017-01-29 DIAGNOSIS — Z125 Encounter for screening for malignant neoplasm of prostate: Secondary | ICD-10-CM

## 2017-02-04 ENCOUNTER — Other Ambulatory Visit: Payer: BLUE CROSS/BLUE SHIELD | Admitting: Internal Medicine

## 2017-02-04 DIAGNOSIS — Z13220 Encounter for screening for lipoid disorders: Secondary | ICD-10-CM

## 2017-02-04 DIAGNOSIS — Z125 Encounter for screening for malignant neoplasm of prostate: Secondary | ICD-10-CM

## 2017-02-04 DIAGNOSIS — Z0000 Encounter for general adult medical examination without abnormal findings: Secondary | ICD-10-CM

## 2017-02-04 LAB — LIPID PANEL
Cholesterol: 142 mg/dL (ref <200)
HDL: 46 mg/dL (ref >40)
LDL Cholesterol (Calc): 82 mg/dL (calc)
NON-HDL CHOLESTEROL (CALC): 96 mg/dL (ref <130)
Total CHOL/HDL Ratio: 3.1 (calc) (ref <5.0)
Triglycerides: 62 mg/dL (ref <150)

## 2017-02-04 LAB — CBC WITH DIFFERENTIAL/PLATELET
BASOS ABS: 62 {cells}/uL (ref 0–200)
Basophils Relative: 1.4 %
Eosinophils Absolute: 132 cells/uL (ref 15–500)
Eosinophils Relative: 3 %
HCT: 47.1 % (ref 38.5–50.0)
Hemoglobin: 15.9 g/dL (ref 13.2–17.1)
Lymphs Abs: 1443 cells/uL (ref 850–3900)
MCH: 31.6 pg (ref 27.0–33.0)
MCHC: 33.8 g/dL (ref 32.0–36.0)
MCV: 93.6 fL (ref 80.0–100.0)
MPV: 10.8 fL (ref 7.5–12.5)
Monocytes Relative: 6.4 %
NEUTROS PCT: 56.4 %
Neutro Abs: 2482 cells/uL (ref 1500–7800)
PLATELETS: 212 10*3/uL (ref 140–400)
RBC: 5.03 10*6/uL (ref 4.20–5.80)
RDW: 12.5 % (ref 11.0–15.0)
TOTAL LYMPHOCYTE: 32.8 %
WBC: 4.4 10*3/uL (ref 3.8–10.8)
WBCMIX: 282 {cells}/uL (ref 200–950)

## 2017-02-04 LAB — COMPLETE METABOLIC PANEL WITH GFR
AG Ratio: 2.3 (calc) (ref 1.0–2.5)
ALT: 16 U/L (ref 9–46)
AST: 17 U/L (ref 10–35)
Albumin: 4.5 g/dL (ref 3.6–5.1)
Alkaline phosphatase (APISO): 43 U/L (ref 40–115)
BILIRUBIN TOTAL: 0.7 mg/dL (ref 0.2–1.2)
BUN: 23 mg/dL (ref 7–25)
CALCIUM: 9.6 mg/dL (ref 8.6–10.3)
CHLORIDE: 107 mmol/L (ref 98–110)
CO2: 31 mmol/L (ref 20–32)
Creat: 1.22 mg/dL (ref 0.70–1.25)
GFR, EST AFRICAN AMERICAN: 72 mL/min/{1.73_m2} (ref >=60)
GFR, Est Non African American: 62 mL/min/{1.73_m2} (ref >=60)
Globulin: 2 g/dL (calc) (ref 1.9–3.7)
Glucose, Bld: 92 mg/dL (ref 65–99)
Potassium: 4.9 mmol/L (ref 3.5–5.3)
Sodium: 143 mmol/L (ref 135–146)
TOTAL PROTEIN: 6.5 g/dL (ref 6.1–8.1)

## 2017-02-04 LAB — PSA: PSA: 0.7 ng/mL (ref <=4.0)

## 2017-02-07 ENCOUNTER — Encounter: Payer: Self-pay | Admitting: Internal Medicine

## 2017-02-07 ENCOUNTER — Ambulatory Visit (INDEPENDENT_AMBULATORY_CARE_PROVIDER_SITE_OTHER): Payer: BLUE CROSS/BLUE SHIELD | Admitting: Internal Medicine

## 2017-02-07 VITALS — BP 130/80 | HR 88 | Ht 66.5 in | Wt 161.0 lb

## 2017-02-07 DIAGNOSIS — E7849 Other hyperlipidemia: Secondary | ICD-10-CM

## 2017-02-07 DIAGNOSIS — Z86010 Personal history of colonic polyps: Secondary | ICD-10-CM

## 2017-02-07 DIAGNOSIS — G454 Transient global amnesia: Secondary | ICD-10-CM

## 2017-02-07 DIAGNOSIS — Z0000 Encounter for general adult medical examination without abnormal findings: Secondary | ICD-10-CM

## 2017-02-07 LAB — POCT URINALYSIS DIPSTICK
APPEARANCE: NORMAL
Bilirubin, UA: NEGATIVE
Blood, UA: NEGATIVE
Glucose, UA: NEGATIVE
KETONES UA: NEGATIVE
Leukocytes, UA: NEGATIVE
NITRITE UA: NEGATIVE
ODOR: NORMAL
PH UA: 6.5 (ref 5.0–8.0)
PROTEIN UA: NEGATIVE
Spec Grav, UA: 1.015 (ref 1.010–1.025)
UROBILINOGEN UA: 0.2 U/dL

## 2017-02-07 NOTE — Progress Notes (Signed)
Subjective:    Patient ID: Jerry Campbell, male    DOB: 1950-11-11, 67 y.o.   MRN: [id number]  HPI 67 year old Male for health maintence exam and evaluation of medical issues.  In February 2018 he was on a ladder trimming a limb and fell some 10-12 feet tearing his ACL and straining his MCL .  He saw Dr. being.  He was given crutches.  He went to PT.  He did not have surgery.  Occasionally the knee will give way with him on occasion and sometimes he has sharp pain for short time.  He has a history of hyperlipidemia treated with statin medication.  He has had a total of 4 episodes of transient global amnesia evaluated by cardiology and neurology.  The last one occurred in May 18, 2013 when his mother passed away.  He was hospitalized briefly at that time and had a negative MRI of the brain.  History of left shoulder pain status post shoulder fracture in May 19, 2011.  He had an MRI of the neck in 05/19/11 showing cervical disc disease with mild impingement multilevel.  History of lumbar back surgery for herniated disc L5-S1 by Dr. Ellene Route May 19, 2011.  He takes Pilates with Leatrice Jewels.  History of low back pain but seems to be doing pretty well with diet at this point.  History of fractured left wrist secondary to a fall in 05-18-93.  History of fractured left fifth toe in the 1980s.  History of fractured right toe in karate in the early 1990s.  Fractured left shoulder with possible rotator cuff tear December 2005.  He had vasectomy May 18, 1989  Colonoscopy is up-to-date  History of hyperplastic colon polyps  In July 2012 he had an allergy consultation for a lingering cough.  Bronchial tree showed mild restrictive pattern.  Allergy skin testing positive for dust mites, cockroach, house dust and mold mix.  He was treated with Proventil inhaler.  Tetanus immunization done June 2010  Patient says he was thought to have suffered a concussion from a fall from a ladder in 18-May-1993  No known drug allergies  Hospitalized in  [place] in June 2011 with an episode of confusion and forgetfulness.  CT of the brain was negative.  This was a third episode of confusion in his life.  In November 2008 he was admitted to the hospital with a similar presentation but had syncope.  He had a very thorough evaluation by Drs. Caryl Comes and Love.  Symptoms have been known to start after giving a presentation to a group of coworkers.  Stress seems to play a role in these episodes.  EEG in 05-19-06 was negative.  2D echocardiogram in May 19, 2006 as well as Cardiolite study were both negative.  MRI of the brain with and without contrast in August 2011 was normal.  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  He is married.  No children with this marriage.  Wife has multiple sclerosis.  Wife and daughter from previous marriage.  Patient's wife formerly worked as a Equities trader butretired due to health issues.  Family history: Mother died of complications of dementia and she also had glaucoma, hypertension, hypothyroidism.  One brother with history of MI in his early 29s.  One sister in good health.  Father with history of MI, prostate cancer and hyperlipidemia as well as glaucoma.    Review of Systems  Constitutional: Negative.   Respiratory: Negative.   Cardiovascular: Negative.   Gastrointestinal: Negative.  All other systems reviewed and are negative.      Objective:   Physical Exam  Constitutional: He is oriented to person, place, and time. He appears well-developed and well-nourished. No distress.  HENT:  Head: Normocephalic and atraumatic.  Right Ear: External ear normal.  Left Ear: External ear normal.  Mouth/Throat: Oropharynx is clear and moist. No oropharyngeal exudate.  Eyes: Conjunctivae and EOM are normal. Pupils are equal, round, and reactive to light. Right eye exhibits no discharge. Left eye exhibits no discharge. No scleral icterus.  Neck: Neck supple. No JVD present. No thyromegaly present.    Cardiovascular: Normal rate, regular rhythm, normal heart sounds and intact distal pulses.  No murmur heard. Pulmonary/Chest: Effort normal and breath sounds normal. No respiratory distress. He has no wheezes. He has no rales.  Abdominal: Soft. Bowel sounds are normal. He exhibits no distension and no mass. There is no tenderness. There is no rebound and no guarding.  Genitourinary: Prostate normal.  Musculoskeletal: He exhibits no edema.  Lymphadenopathy:    He has no cervical adenopathy.  Neurological: He is alert and oriented to person, place, and time. He has normal reflexes. No cranial nerve deficit. Coordination normal.  Skin: Skin is warm and dry. No rash noted. He is not diaphoretic.  Psychiatric: He has a normal mood and affect. His behavior is normal. Judgment and thought content normal.  Vitals reviewed.         Assessment & Plan:  Patient had episodes of transient global amnesia but none since 2015  Hyperlipidemia  History of low back pain  History of knee injury 2018-improved  History of left shoulder pain  Allergic rhinitis  History of hyperplastic colon polyps  History of herniated disc surgery L5-S1 in 2013  Plan: Lab work reviewed and is entirely within normal limits including lipid panel, liver functions and PSA  Return in 1 year or as needed.

## 2017-02-20 ENCOUNTER — Encounter: Payer: Self-pay | Admitting: Internal Medicine

## 2017-02-20 NOTE — Patient Instructions (Signed)
It was a pleasure to see you today.  Lab work is within normal limits.  Return in 1 year or as needed.

## 2017-05-14 IMAGING — DX DG KNEE COMPLETE 4+V*R*
4 series · 4 of 4 positions shown · non-contrast
Comparison: None

CLINICAL DATA: Fell from a ladder while pruning trees, felt a pop
in RIGHT knee, cannot stand up, cannot bear weight

EXAM:
RIGHT KNEE - COMPLETE 4+ VIEW

[knee ap]
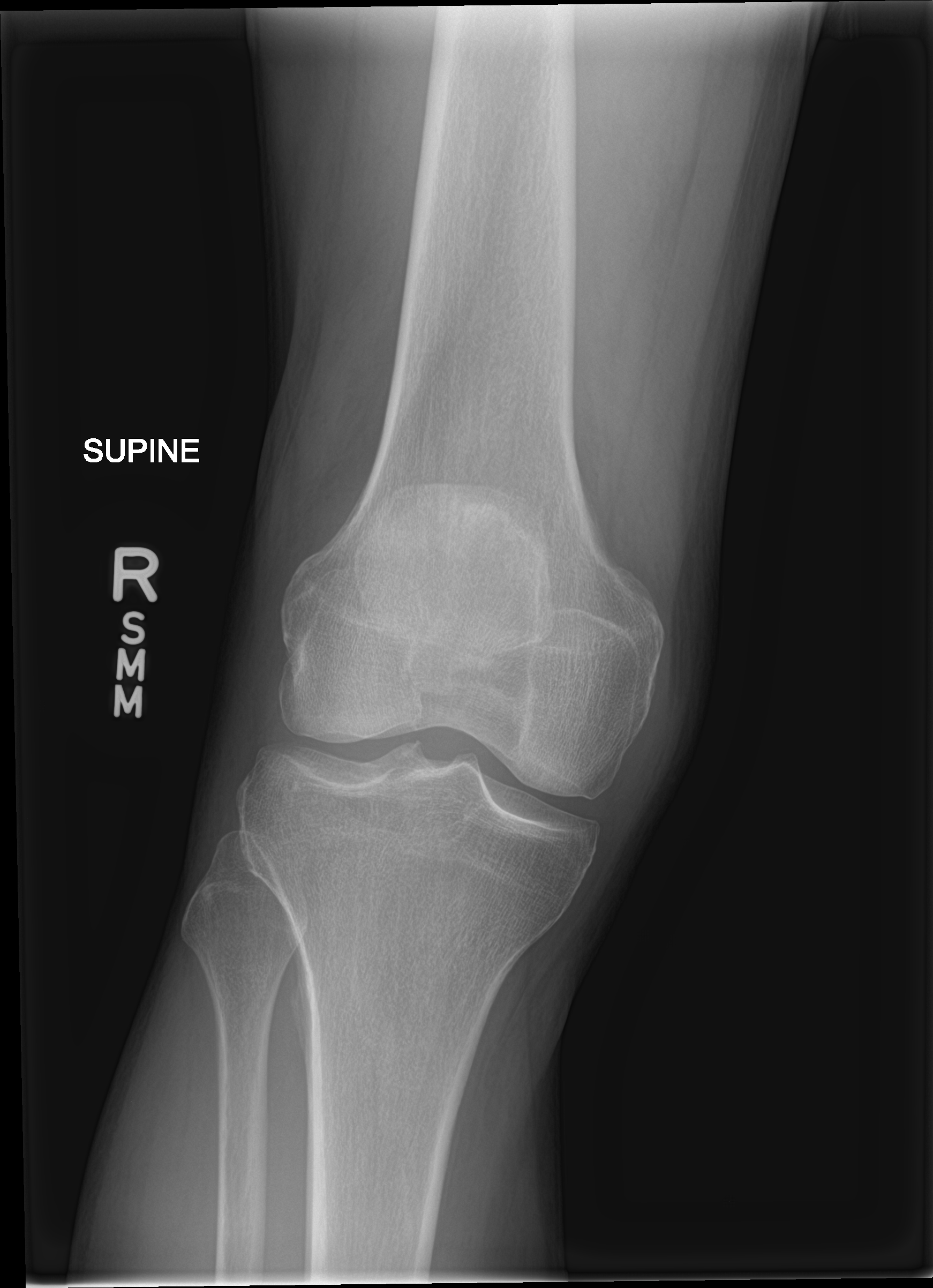

[knee obl (1 of 2)]
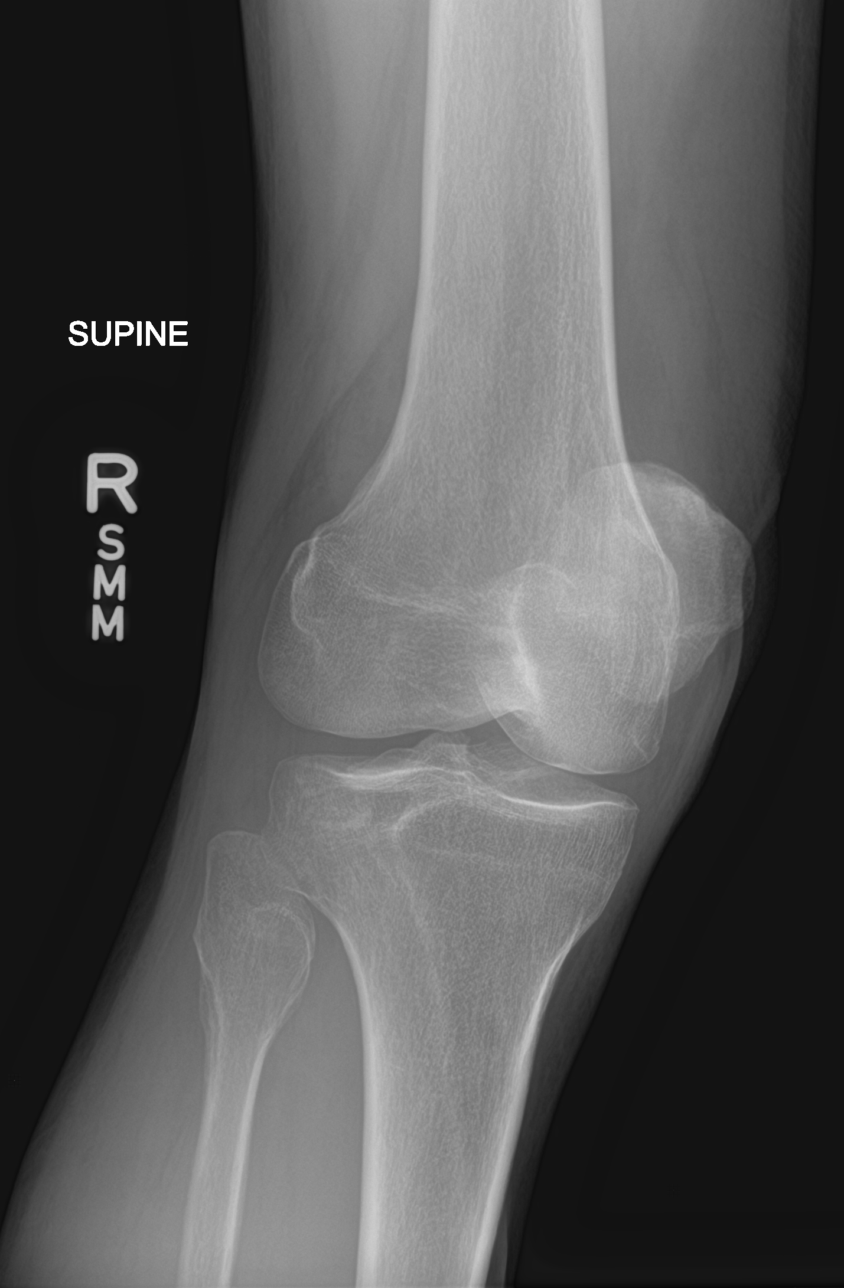

[knee obl (2 of 2)]
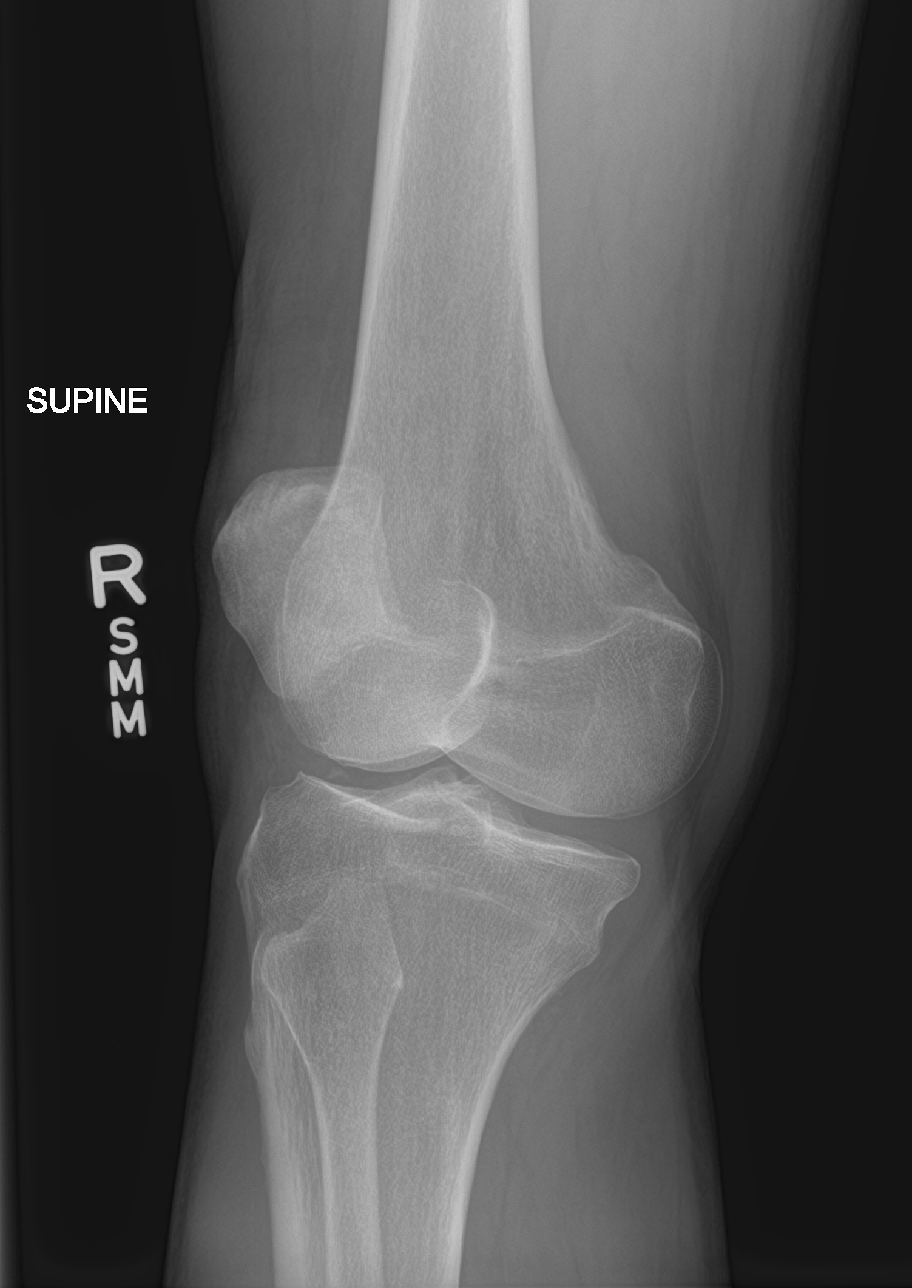

[knee lat]
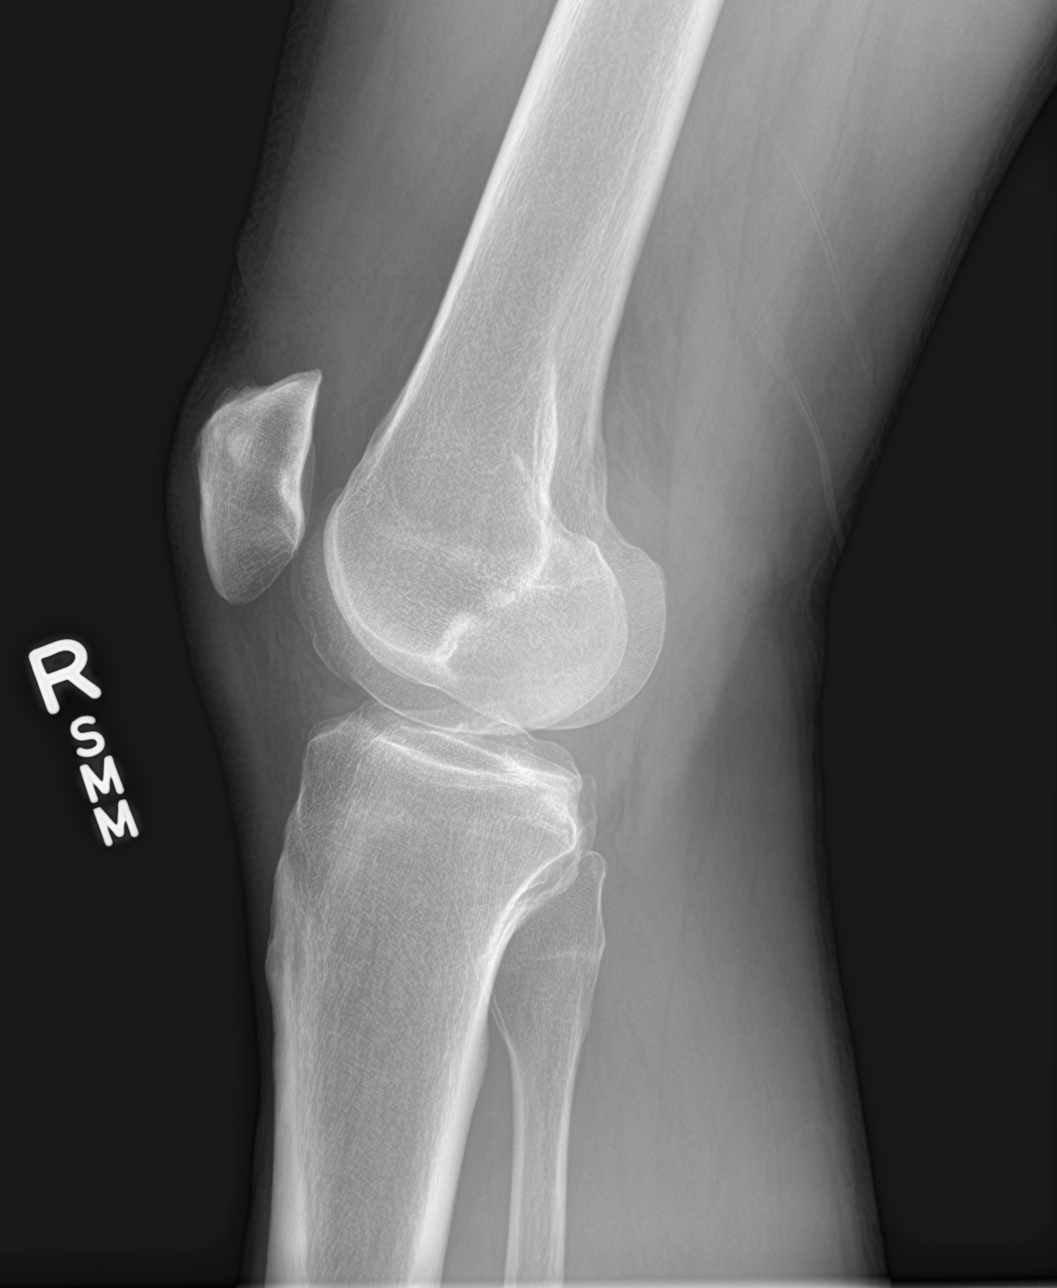

[4 of 4 positions shown; findings below may reference images not displayed]

FINDINGS: Mild osseous demineralization.

Joint spaces preserved.

No acute fracture, dislocation, or bone destruction.

No definite knee joint effusion.
IMPRESSION: No acute osseous abnormalities.

## 2017-06-18 DIAGNOSIS — H5213 Myopia, bilateral: Secondary | ICD-10-CM | POA: Diagnosis not present

## 2017-06-25 DIAGNOSIS — L570 Actinic keratosis: Secondary | ICD-10-CM | POA: Diagnosis not present

## 2017-06-25 DIAGNOSIS — Z1283 Encounter for screening for malignant neoplasm of skin: Secondary | ICD-10-CM | POA: Diagnosis not present

## 2017-06-25 DIAGNOSIS — X32XXXD Exposure to sunlight, subsequent encounter: Secondary | ICD-10-CM | POA: Diagnosis not present

## 2017-06-25 DIAGNOSIS — L308 Other specified dermatitis: Secondary | ICD-10-CM | POA: Diagnosis not present

## 2017-07-17 DIAGNOSIS — R69 Illness, unspecified: Secondary | ICD-10-CM | POA: Diagnosis not present

## 2017-10-15 ENCOUNTER — Encounter: Payer: Self-pay | Admitting: Internal Medicine

## 2017-10-15 DIAGNOSIS — L570 Actinic keratosis: Secondary | ICD-10-CM | POA: Diagnosis not present

## 2017-10-15 DIAGNOSIS — X32XXXD Exposure to sunlight, subsequent encounter: Secondary | ICD-10-CM | POA: Diagnosis not present

## 2017-10-15 DIAGNOSIS — L308 Other specified dermatitis: Secondary | ICD-10-CM | POA: Diagnosis not present

## 2017-11-01 ENCOUNTER — Other Ambulatory Visit: Payer: Self-pay | Admitting: Internal Medicine

## 2017-11-01 ENCOUNTER — Encounter: Payer: Self-pay | Admitting: Internal Medicine

## 2017-11-01 ENCOUNTER — Ambulatory Visit (INDEPENDENT_AMBULATORY_CARE_PROVIDER_SITE_OTHER): Payer: Medicare HMO | Admitting: Internal Medicine

## 2017-11-01 VITALS — BP 140/82 | HR 77 | Temp 98.0°F | Ht 66.5 in | Wt 163.0 lb

## 2017-11-01 DIAGNOSIS — E7849 Other hyperlipidemia: Secondary | ICD-10-CM

## 2017-11-01 DIAGNOSIS — L280 Lichen simplex chronicus: Secondary | ICD-10-CM | POA: Diagnosis not present

## 2017-11-01 DIAGNOSIS — Z23 Encounter for immunization: Secondary | ICD-10-CM

## 2017-11-01 NOTE — Progress Notes (Signed)
   Subjective:    Patient ID: Jerry Campbell, male    DOB: [birth year], 67 y.o.   MRN: [id number]  HPI  Here regarding bilateral arm dermatitis. 5% patients can suffer from eczema from statin medication and his dermatologist mentioned this.  Now has been off Simvastatin x about 4 months.  Was on 10 mg daily.  Has been seeing Dermatologist for dermatitis on arms since early this year. Biopsy showed subtle lichenoid interface dermatitis suggesting drug reaction or connective tissue disease.  He had fasting lipid panel done off of simvastatin.  Total cholesterol had increased from 142 to 224. LDL had increased from 82 to 153.  Patient would like some testing for connective tissue disease.  Sed rate is 19.  ANA is negative.        Review of Systems see above     Objective:   Physical Exam Eczematous type dermatitis on forearms.       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 Dayton Martes  Hyperlipidemia-mixed hyperlipidemia off statin therapy  Plan: Sed rate and ANA are within normal limits.  Simvastatin has been associated with this type of dermatitis.  Patient might elect to stay off of the medication and reevaluate in 6 months.  Flu vaccine given.

## 2017-11-01 NOTE — Progress Notes (Signed)
   Subjective:    Patient ID: Jerry Campbell, male    DOB: 10/13/1950, 67 y.o.   MRN: [id number]  HPI    Review of Systems     Objective:   Physical Exam        Assessment & Plan:

## 2017-11-04 ENCOUNTER — Other Ambulatory Visit: Payer: Medicare HMO | Admitting: Internal Medicine

## 2017-11-04 DIAGNOSIS — E7849 Other hyperlipidemia: Secondary | ICD-10-CM

## 2017-11-04 DIAGNOSIS — R21 Rash and other nonspecific skin eruption: Secondary | ICD-10-CM | POA: Diagnosis not present

## 2017-11-05 LAB — LIPID PANEL
Cholesterol: 224 mg/dL — ABNORMAL HIGH (ref <200)
HDL: 50 mg/dL (ref >40)
LDL CHOLESTEROL (CALC): 153 mg/dL — AB
NON-HDL CHOLESTEROL (CALC): 174 mg/dL — AB (ref <130)
TRIGLYCERIDES: 98 mg/dL (ref <150)
Total CHOL/HDL Ratio: 4.5 (calc) (ref <5.0)

## 2017-11-05 LAB — ANA: ANA: NEGATIVE

## 2017-11-05 LAB — SEDIMENTATION RATE: SED RATE: 19 mm/h (ref 0–20)

## 2017-11-21 NOTE — Patient Instructions (Signed)
Sed rate and ANA are normal.  Follow-up in 6 months.  Stay off statin.

## 2018-01-29 DIAGNOSIS — R69 Illness, unspecified: Secondary | ICD-10-CM | POA: Diagnosis not present

## 2018-02-10 ENCOUNTER — Other Ambulatory Visit: Payer: Medicare HMO | Admitting: Internal Medicine

## 2018-02-10 DIAGNOSIS — E785 Hyperlipidemia, unspecified: Secondary | ICD-10-CM | POA: Diagnosis not present

## 2018-02-10 DIAGNOSIS — Z0000 Encounter for general adult medical examination without abnormal findings: Secondary | ICD-10-CM

## 2018-02-10 DIAGNOSIS — E7849 Other hyperlipidemia: Secondary | ICD-10-CM | POA: Diagnosis not present

## 2018-02-10 DIAGNOSIS — M5136 Other intervertebral disc degeneration, lumbar region: Secondary | ICD-10-CM

## 2018-02-10 DIAGNOSIS — G454 Transient global amnesia: Secondary | ICD-10-CM | POA: Diagnosis not present

## 2018-02-11 LAB — LIPID PANEL
Cholesterol: 199 mg/dL (ref <200)
HDL: 48 mg/dL (ref >40)
LDL Cholesterol (Calc): 131 mg/dL (calc) — ABNORMAL HIGH
Non-HDL Cholesterol (Calc): 151 mg/dL (calc) — ABNORMAL HIGH (ref <130)
Total CHOL/HDL Ratio: 4.1 (calc) (ref <5.0)
Triglycerides: 94 mg/dL (ref <150)

## 2018-02-11 LAB — CBC WITH DIFFERENTIAL/PLATELET
Absolute Monocytes: 298 cells/uL (ref 200–950)
BASOS ABS: 50 {cells}/uL (ref 0–200)
Basophils Relative: 1.2 %
Eosinophils Absolute: 118 cells/uL (ref 15–500)
Eosinophils Relative: 2.8 %
HCT: 46.8 % (ref 38.5–50.0)
Hemoglobin: 16 g/dL (ref 13.2–17.1)
Lymphs Abs: 1537 cells/uL (ref 850–3900)
MCH: 32 pg (ref 27.0–33.0)
MCHC: 34.2 g/dL (ref 32.0–36.0)
MCV: 93.6 fL (ref 80.0–100.0)
MONOS PCT: 7.1 %
MPV: 10.5 fL (ref 7.5–12.5)
NEUTROS ABS: 2197 {cells}/uL (ref 1500–7800)
Neutrophils Relative %: 52.3 %
Platelets: 214 10*3/uL (ref 140–400)
RBC: 5 10*6/uL (ref 4.20–5.80)
RDW: 12.4 % (ref 11.0–15.0)
Total Lymphocyte: 36.6 %
WBC: 4.2 10*3/uL (ref 3.8–10.8)

## 2018-02-11 LAB — COMPLETE METABOLIC PANEL WITH GFR
AG RATIO: 2.1 (calc) (ref 1.0–2.5)
ALT: 15 U/L (ref 9–46)
AST: 16 U/L (ref 10–35)
Albumin: 4.2 g/dL (ref 3.6–5.1)
Alkaline phosphatase (APISO): 47 U/L (ref 40–115)
BUN: 20 mg/dL (ref 7–25)
CHLORIDE: 108 mmol/L (ref 98–110)
CO2: 29 mmol/L (ref 20–32)
Calcium: 9.1 mg/dL (ref 8.6–10.3)
Creat: 1.11 mg/dL (ref 0.70–1.25)
GFR, Est African American: 80 mL/min/{1.73_m2} (ref >=60)
GFR, Est Non African American: 69 mL/min/{1.73_m2} (ref >=60)
Globulin: 2 g/dL (calc) (ref 1.9–3.7)
Glucose, Bld: 87 mg/dL (ref 65–99)
POTASSIUM: 4.7 mmol/L (ref 3.5–5.3)
Sodium: 143 mmol/L (ref 135–146)
Total Bilirubin: 0.7 mg/dL (ref 0.2–1.2)
Total Protein: 6.2 g/dL (ref 6.1–8.1)

## 2018-02-11 LAB — PSA: PSA: 0.6 ng/mL (ref <=4.0)

## 2018-02-14 ENCOUNTER — Ambulatory Visit (INDEPENDENT_AMBULATORY_CARE_PROVIDER_SITE_OTHER): Payer: Medicare HMO | Admitting: Internal Medicine

## 2018-02-14 ENCOUNTER — Encounter: Payer: Medicare HMO | Admitting: Internal Medicine

## 2018-02-14 ENCOUNTER — Encounter: Payer: Self-pay | Admitting: Internal Medicine

## 2018-02-14 ENCOUNTER — Other Ambulatory Visit: Payer: Medicare HMO | Admitting: Internal Medicine

## 2018-02-14 VITALS — BP 140/90 | HR 64 | Ht 66.5 in | Wt 166.0 lb

## 2018-02-14 DIAGNOSIS — L280 Lichen simplex chronicus: Secondary | ICD-10-CM

## 2018-02-14 DIAGNOSIS — Z86010 Personal history of colonic polyps: Secondary | ICD-10-CM | POA: Diagnosis not present

## 2018-02-14 DIAGNOSIS — Z23 Encounter for immunization: Secondary | ICD-10-CM | POA: Diagnosis not present

## 2018-02-14 DIAGNOSIS — Z0000 Encounter for general adult medical examination without abnormal findings: Secondary | ICD-10-CM

## 2018-02-14 LAB — POCT URINALYSIS DIPSTICK
Appearance: NEGATIVE
Bilirubin, UA: NEGATIVE
Glucose, UA: NEGATIVE
Ketones, UA: NEGATIVE
Leukocytes, UA: NEGATIVE
Nitrite, UA: NEGATIVE
Odor: NEGATIVE
Protein, UA: NEGATIVE
RBC UA: NEGATIVE
Spec Grav, UA: 1.015 (ref 1.010–1.025)
Urobilinogen, UA: 0.2 E.U./dL
pH, UA: 6.5 (ref 5.0–8.0)

## 2018-02-14 NOTE — Progress Notes (Signed)
Subjective:    Patient ID: Jerry Campbell, male    DOB: [birth year], 68 y.o.   MRN: [id number]  HPI 68 year old Male for welcome to Medicare exam, health maintenance exam and evaluation of medical issues. Because of some eczema issues, Dermatologist suggested he stop statin medication which he did. LDL is 131. We will continue to monitor lipids off statin for now.  Need Prevnar 13. Order given for Shingrix.  In February 2018 he was on a ladder trimming a lemon fell some 10 to 12 feet tearing his ACL and strained his MCL.  He did not have surgery.  Occasionally his knee will give way with him.  He has had a total of 4 episodes of transient global amnesia evaluated by cardiology and neurology.  The last one occurred in 05-07-13 when his mother passed away.  He was hospitalized briefly at that time and had negative MRI of the brain.  History of left shoulder pain status post shoulder fracture in 05-08-2011.  He had an MRI of the neck in 05-08-11 showing cervical disc disease with mild impingement multilevel involvement.  History of lumbar back surgery for herniated disc L5-S1 by Dr. Ellene Route.  He takes Pilates with [employer].  History of low back pain but seems to be doing pretty well.  Had vasectomy 07-May-1989.  Colonoscopy is up-to-date.  History of hyperplastic colon polyps.  History of fractured left wrist secondary to a fall in 05/07/1993.  History of fractured left fifth toe in the 1980s.  History of fractured right toe in karate in the early 1990s.  Fractured left shoulder with possible rotator cuff tear December 2005.  Patient says he was thought to have suffered a concussion from a fall from a ladder in 05-07-1993.  No known drug allergies.  Hospitalized in [place] in June 2011 with an episode of confusion and forgetfulness.  CT of the brain was negative.  This was the third episode of confusion in his life.  In November 2008 he was admitted to the hospital with a similar presentation but had syncope.  He had  a very thorough evaluation by Drs. Caryl Comes and low.  Symptoms have been known to start after giving a presentation to a group of coworkers.  Stress seems to play a role in these episodes.  EEG in 2006-05-08 was negative.  2D echocardiogram in May 08, 2006 as well as Cardiolite study were both negative.  MRI of the brain with and without contrast in August 2011 were normal.  He has not had any recent episodes.  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  He is married.  No children with this marriage.  Wife has multiple sclerosis.  Patient's wife formerly worked as a Equities trader but retired due to health issues.  Family history: Mother died of complications of dementia.  She also had glaucoma, hypertension and hypothyroidism.  One brother with history of MI in his early 5s.  One sister in good health.  Father with history of MI, prostate cancer, hyperlipidemia as well as glaucoma.    Review of Systems only real complaint is dermatitis which has been an issue for the past few months.  Is now off statin therapy due to 2 concerns a statins could be causing dermatitis.     Objective:   Physical Exam Vitals signs reviewed.  Constitutional:      General: He is not in acute distress.    Appearance: Normal appearance. He is not ill-appearing  or diaphoretic.  HENT:     Head: Normocephalic and atraumatic.     Right Ear: Tympanic membrane normal.     Left Ear: Tympanic membrane normal.     Nose: Nose normal.     Mouth/Throat:     Mouth: Mucous membranes are moist.     Pharynx: Oropharynx is clear.  Eyes:     General: No scleral icterus.    Extraocular Movements: Extraocular movements intact.     Conjunctiva/sclera: Conjunctivae normal.     Pupils: Pupils are equal, round, and reactive to light.  Neck:     Musculoskeletal: No neck rigidity.     Vascular: No carotid bruit.     Comments: No thyromegaly Cardiovascular:     Rate and Rhythm: Normal rate and regular rhythm.      Pulses: Normal pulses.     Heart sounds: Normal heart sounds. No murmur.  Pulmonary:     Effort: Pulmonary effort is normal. No respiratory distress.     Breath sounds: Normal breath sounds. No wheezing or rales.  Abdominal:     General: There is no distension.     Palpations: Abdomen is soft. There is no mass.     Tenderness: There is no abdominal tenderness. There is no guarding or rebound.     Hernia: No hernia is present.  Genitourinary:    Prostate: Normal.  Musculoskeletal:        General: No deformity.     Right lower leg: No edema.     Left lower leg: No edema.  Lymphadenopathy:     Cervical: No cervical adenopathy.  Skin:    General: Skin is warm and dry.  Neurological:     General: No focal deficit present.     Mental Status: He is alert and oriented to person, place, and time.     Cranial Nerves: No cranial nerve deficit.     Coordination: Coordination normal.  Psychiatric:        Mood and Affect: Mood normal.        Behavior: Behavior normal.        Thought Content: Thought content normal.        Judgment: Judgment normal.           Assessment & Plan:  Hyperlipidemia-he is now off statin therapy.  Elevated LDL of 131 but will continue off statin therapy for now since dermatologist thinks it might be contributing to his dermatitis  History of transient global amnesia-multiple episodes but none since 2015  History of low back pain  History of knee injury 2018  History of left shoulder pain  Allergic rhinitis  History of hyperplastic colon polyps  History of herniated disc surgery L5-S1 in 2013  Plan: Continue current medications.  Stay off statin.  Return in 1 year or as needed.  Subjective:   Patient presents for Medicare Annual/Subsequent preventive examination.  Review Past Medical/Family/Social: See above   Risk Factors  Current exercise habits: Physically active about house and yard.  Takes Pilates.  Low-fat low carbohydrate Dietary  issues discussed:   Cardiac risk factors: Hyperlipidemia  Depression Screen  (Note: if answer to either of the following is "Yes", a more complete depression screening is indicated)   Over the past two weeks, have you felt down, depressed or hopeless? No  Over the past two weeks, have you felt little interest or pleasure in doing things? No Have you lost interest or pleasure in daily life? No Do you often feel hopeless?  No Do you cry easily over simple problems? No   Activities of Daily Living  In your present state of health, do you have any difficulty performing the following activities?:   Driving? No  Managing money? No  Feeding yourself? No  Getting from bed to chair? No  Climbing a flight of stairs? No  Preparing food and eating?: No  Bathing or showering? No  Getting dressed: No  Getting to the toilet? No  Using the toilet:No  Moving around from place to place: No  In the past year have you fallen or had a near fall?:No  Are you sexually active?  Yes Do you have more than one partner? No   Hearing Difficulties: No  Do you often ask people to speak up or repeat themselves? No  Do you experience ringing or noises in your ears? No  Do you have difficulty understanding soft or whispered voices? No  Do you feel that you have a problem with memory? No Do you often misplace items? No    Home Safety:  Do you have a smoke alarm at your residence? Yes Do you have grab bars in the bathroom?  None Do you have throw rugs in your house?  Yes   Cognitive Testing  Alert? Yes Normal Appearance?Yes  Oriented to person? Yes Place? Yes  Time? Yes  Recall of three objects? Yes  Can perform simple calculations? Yes  Displays appropriate judgment?Yes  Can read the correct time from a watch face?Yes   List the Names of Other Physician/Practitioners you currently use:  See referral list for the physicians patient is currently seeing.     Review of Systems: See  above   Objective:     General appearance: Appears younger than stated age Head: Normocephalic, without obvious abnormality, atraumatic  Eyes: conj clear, EOMi PEERLA  Ears: normal TM's and external ear canals both ears  Nose: Nares normal. Septum midline. Mucosa normal. No drainage or sinus tenderness.  Throat: lips, mucosa, and tongue normal; teeth and gums normal  Neck: no adenopathy, no carotid bruit, no JVD, supple, symmetrical, trachea midline and thyroid not enlarged, symmetric, no tenderness/mass/nodules  No CVA tenderness.  Lungs: clear to auscultation bilaterally  Breasts: normal appearance, no masses or tenderness Heart: regular rate and rhythm, S1, S2 normal, no murmur, click, rub or gallop  Abdomen: soft, non-tender; bowel sounds normal; no masses, no organomegaly  Musculoskeletal: ROM normal in all joints, no crepitus, no deformity, Normal muscle strengthen. Back  is symmetric, no curvature. Skin: Skin color, texture, turgor normal. No rashes or lesions  Lymph nodes: Cervical, supraclavicular, and axillary nodes normal.  Neurologic: CN 2 -12 Normal, Normal symmetric reflexes. Normal coordination and gait  Psych: Alert & Oriented x 3, Mood appear stable.    Assessment:    Annual wellness medicare exam   Plan:    During the course of the visit the patient was educated and counseled about appropriate screening and preventive services including:   Shingrix vaccine  Prevnar 13     Patient Instructions (the written plan) was given to the patient.  Medicare Attestation  I have personally reviewed:  The patient's medical and social history  Their use of alcohol, tobacco or illicit drugs  Their current medications and supplements  The patient's functional ability including ADLs,fall risks, home safety risks, cognitive, and hearing and visual impairment  Diet and physical activities  Evidence for depression or mood disorders  The patient's weight, height, BMI, and  visual acuity  have been recorded in the chart. I have made referrals, counseling, and provided education to the patient based on review of the above and I have provided the patient with a written personalized care plan for preventive services.

## 2018-03-15 NOTE — Patient Instructions (Signed)
It was a pleasure to see you today.  Stay off statin medication.  Follow-up in 1 year or as needed.  Order given for Shingrix vaccine.  Prevnar 13 given.

## 2018-03-28 ENCOUNTER — Encounter: Payer: Self-pay | Admitting: Internal Medicine

## 2018-03-28 ENCOUNTER — Ambulatory Visit (INDEPENDENT_AMBULATORY_CARE_PROVIDER_SITE_OTHER): Payer: Medicare HMO | Admitting: Internal Medicine

## 2018-03-28 ENCOUNTER — Other Ambulatory Visit: Payer: Self-pay

## 2018-03-28 VITALS — BP 130/90 | HR 77 | Temp 98.6°F | Ht 66.5 in | Wt 166.0 lb

## 2018-03-28 DIAGNOSIS — J22 Unspecified acute lower respiratory infection: Secondary | ICD-10-CM | POA: Diagnosis not present

## 2018-03-28 MED ORDER — AMOXICILLIN-POT CLAVULANATE 500-125 MG PO TABS: 1.0000 | ORAL_TABLET | Freq: Three times a day (TID) | ORAL | 0 refills | Status: DC

## 2018-03-28 NOTE — Progress Notes (Signed)
   Subjective:    Patient ID: Jerry Campbell, male    DOB: 1950/09/07, 68 y.o.   MRN: [id number]  HPI 68 year old Male in today with cough for 2 weeks.  No fever or shaking chills.  No hemoptysis.  No sore throat.  No myalgias.    Review of Systems     Objective:   Physical Exam Blood pressure 130/90, temperature 98.6 degrees pulse oximetry 98%.  Skin warm and dry.  Nodes none.  TMs and pharynx are clear.  Neck is supple.  Chest clear to auscultation without rales or wheezing.       Assessment & Plan:  Acute lower respiratory infection  Plan: Augmentin 500 mg 3 times a day for 10 days.  Call if symptoms worsen or fail to improve.

## 2018-04-19 NOTE — Patient Instructions (Signed)
Augmentin 500 mg 3 times a day for 10 days.

## 2018-06-20 DIAGNOSIS — H5210 Myopia, unspecified eye: Secondary | ICD-10-CM | POA: Diagnosis not present

## 2018-06-27 DIAGNOSIS — L821 Other seborrheic keratosis: Secondary | ICD-10-CM | POA: Diagnosis not present

## 2018-06-27 DIAGNOSIS — Z1283 Encounter for screening for malignant neoplasm of skin: Secondary | ICD-10-CM | POA: Diagnosis not present

## 2018-06-27 DIAGNOSIS — L720 Epidermal cyst: Secondary | ICD-10-CM | POA: Diagnosis not present

## 2018-06-27 DIAGNOSIS — X32XXXD Exposure to sunlight, subsequent encounter: Secondary | ICD-10-CM | POA: Diagnosis not present

## 2018-06-27 DIAGNOSIS — L570 Actinic keratosis: Secondary | ICD-10-CM | POA: Diagnosis not present

## 2018-08-04 ENCOUNTER — Other Ambulatory Visit: Payer: Medicare HMO | Admitting: Internal Medicine

## 2018-08-04 ENCOUNTER — Other Ambulatory Visit: Payer: Self-pay

## 2018-08-04 ENCOUNTER — Ambulatory Visit: Payer: Medicare HMO | Admitting: Internal Medicine

## 2018-08-04 DIAGNOSIS — E7849 Other hyperlipidemia: Secondary | ICD-10-CM | POA: Diagnosis not present

## 2018-08-05 LAB — LIPID PANEL
Cholesterol: 183 mg/dL (ref <200)
HDL: 54 mg/dL (ref >=40)
LDL Cholesterol (Calc): 115 mg/dL (calc) — ABNORMAL HIGH
Non-HDL Cholesterol (Calc): 129 mg/dL (calc) (ref <130)
Total CHOL/HDL Ratio: 3.4 (calc) (ref <5.0)
Triglycerides: 56 mg/dL (ref <150)

## 2018-08-07 ENCOUNTER — Other Ambulatory Visit: Payer: Self-pay

## 2018-08-07 ENCOUNTER — Encounter: Payer: Self-pay | Admitting: Internal Medicine

## 2018-08-07 ENCOUNTER — Ambulatory Visit (INDEPENDENT_AMBULATORY_CARE_PROVIDER_SITE_OTHER): Payer: Medicare HMO | Admitting: Internal Medicine

## 2018-08-07 VITALS — BP 120/88 | HR 82 | Ht 66.5 in | Wt 158.0 lb

## 2018-08-07 DIAGNOSIS — E7800 Pure hypercholesterolemia, unspecified: Secondary | ICD-10-CM | POA: Diagnosis not present

## 2018-08-07 DIAGNOSIS — L280 Lichen simplex chronicus: Secondary | ICD-10-CM | POA: Diagnosis not present

## 2018-08-07 NOTE — Progress Notes (Signed)
   Subjective:    Patient ID: Jerry Campbell, male    DOB: [birth year], 68 y.o.   MRN: [id number]  HPI 68 year old Male with history of eczema seen by Dr. Dayton Martes suggested a few months ago that he stopped his statin medication.  His eczema did improve.  He is now here for follow-up in 55-month recheck.  In January his LDL was 131, total cholesterol 199, HDL 48 and triglycerides 94.  Now his LDL is 115.  Since his eczema has been present he would like to stay off statin and I think that is okay at this point.  We will continue to monitor to 6 months.  He has been staying down near [place] with his sister who had to have some emergency surgery.  His wife has multiple sclerosis.  He continues to watch his weight and gets plenty of exercise.    Review of Systems see above-history of several episodes of transient global amnesia but none recently     Objective:   Physical Exam Blood pressure 120/88, pulse 92, weight 158 pounds.  BMI 25.12  Skin warm and dry.  Nodes none.  No carotid bruits.  Chest clear to auscultation.  Cardiac exam regular rate and rhythm normal S1 and S2.  Extremities without edema.       Assessment & Plan:  Mildly elevated LDL at 115 off statin medication.  Continue to monitor.  10 months ago his total cholesterol was 224 with an LDL of 153.  He has been able to get this down with just diet and exercise since stopping statin medication.  History of multiple episodes of transient global amnesia  History of musculoskeletal pain  Plan: Follow-up in 6 months with Medicare wellness and health maintenance exam.  Stay off statin medication.  Have annual flu vaccine.

## 2018-08-14 DIAGNOSIS — R69 Illness, unspecified: Secondary | ICD-10-CM | POA: Diagnosis not present

## 2018-09-14 NOTE — Patient Instructions (Signed)
It was a pleasure to see you today.  Stay off statin medication.  Continue diet and exercise efforts and follow-up with health maintenance exam and fasting labs in 6 months

## 2018-10-03 ENCOUNTER — Other Ambulatory Visit: Payer: Self-pay

## 2018-10-03 ENCOUNTER — Encounter: Payer: Self-pay | Admitting: Internal Medicine

## 2018-10-03 ENCOUNTER — Ambulatory Visit (INDEPENDENT_AMBULATORY_CARE_PROVIDER_SITE_OTHER): Payer: Medicare HMO | Admitting: Internal Medicine

## 2018-10-03 DIAGNOSIS — Z23 Encounter for immunization: Secondary | ICD-10-CM | POA: Diagnosis not present

## 2018-10-03 NOTE — Patient Instructions (Signed)
Patient received a flu vaccine IM L deltoid, AV, CMA  

## 2018-10-03 NOTE — Progress Notes (Signed)
Flu vaccine given by CMA 

## 2019-02-09 ENCOUNTER — Other Ambulatory Visit: Payer: Medicare HMO | Admitting: Internal Medicine

## 2019-02-09 ENCOUNTER — Other Ambulatory Visit: Payer: Self-pay

## 2019-02-09 DIAGNOSIS — Z0000 Encounter for general adult medical examination without abnormal findings: Secondary | ICD-10-CM | POA: Diagnosis not present

## 2019-02-09 DIAGNOSIS — Z1329 Encounter for screening for other suspected endocrine disorder: Secondary | ICD-10-CM

## 2019-02-09 DIAGNOSIS — Z125 Encounter for screening for malignant neoplasm of prostate: Secondary | ICD-10-CM | POA: Diagnosis not present

## 2019-02-09 DIAGNOSIS — Z0100 Encounter for examination of eyes and vision without abnormal findings: Secondary | ICD-10-CM | POA: Diagnosis not present

## 2019-02-09 DIAGNOSIS — E7849 Other hyperlipidemia: Secondary | ICD-10-CM

## 2019-02-10 LAB — LIPID PANEL
Cholesterol: 191 mg/dL (ref <200)
HDL: 47 mg/dL (ref >=40)
LDL Cholesterol (Calc): 126 mg/dL (calc) — ABNORMAL HIGH
Non-HDL Cholesterol (Calc): 144 mg/dL (calc) — ABNORMAL HIGH (ref <130)
Total CHOL/HDL Ratio: 4.1 (calc) (ref <5.0)
Triglycerides: 82 mg/dL (ref <150)

## 2019-02-10 LAB — COMPLETE METABOLIC PANEL WITH GFR
AG Ratio: 1.9 (calc) (ref 1.0–2.5)
ALT: 17 U/L (ref 9–46)
AST: 18 U/L (ref 10–35)
Albumin: 4.1 g/dL (ref 3.6–5.1)
Alkaline phosphatase (APISO): 42 U/L (ref 35–144)
BUN: 22 mg/dL (ref 7–25)
CO2: 29 mmol/L (ref 20–32)
Calcium: 9.1 mg/dL (ref 8.6–10.3)
Chloride: 107 mmol/L (ref 98–110)
Creat: 1.12 mg/dL (ref 0.70–1.25)
GFR, Est African American: 79 mL/min/{1.73_m2} (ref >=60)
GFR, Est Non African American: 68 mL/min/{1.73_m2} (ref >=60)
Globulin: 2.2 g/dL (calc) (ref 1.9–3.7)
Glucose, Bld: 90 mg/dL (ref 65–99)
Potassium: 4.5 mmol/L (ref 3.5–5.3)
Sodium: 142 mmol/L (ref 135–146)
Total Bilirubin: 0.6 mg/dL (ref 0.2–1.2)
Total Protein: 6.3 g/dL (ref 6.1–8.1)

## 2019-02-10 LAB — CBC WITH DIFFERENTIAL/PLATELET
Absolute Monocytes: 320 cells/uL (ref 200–950)
Basophils Absolute: 48 cells/uL (ref 0–200)
Basophils Relative: 1.2 %
Eosinophils Absolute: 100 cells/uL (ref 15–500)
Eosinophils Relative: 2.5 %
HCT: 45.2 % (ref 38.5–50.0)
Hemoglobin: 16 g/dL (ref 13.2–17.1)
Lymphs Abs: 1416 cells/uL (ref 850–3900)
MCH: 33.8 pg — ABNORMAL HIGH (ref 27.0–33.0)
MCHC: 35.4 g/dL (ref 32.0–36.0)
MCV: 95.6 fL (ref 80.0–100.0)
MPV: 10.4 fL (ref 7.5–12.5)
Monocytes Relative: 8 %
Neutro Abs: 2116 cells/uL (ref 1500–7800)
Neutrophils Relative %: 52.9 %
Platelets: 232 10*3/uL (ref 140–400)
RBC: 4.73 10*6/uL (ref 4.20–5.80)
RDW: 12.6 % (ref 11.0–15.0)
Total Lymphocyte: 35.4 %
WBC: 4 10*3/uL (ref 3.8–10.8)

## 2019-02-10 LAB — PSA: PSA: 0.7 ng/mL (ref <=4.0)

## 2019-02-16 ENCOUNTER — Ambulatory Visit (INDEPENDENT_AMBULATORY_CARE_PROVIDER_SITE_OTHER): Payer: Medicare HMO | Admitting: Internal Medicine

## 2019-02-16 ENCOUNTER — Encounter: Payer: Self-pay | Admitting: Internal Medicine

## 2019-02-16 ENCOUNTER — Other Ambulatory Visit: Payer: Self-pay

## 2019-02-16 VITALS — BP 100/70 | HR 80 | Temp 98.0°F | Ht 66.5 in | Wt 157.0 lb

## 2019-02-16 DIAGNOSIS — Z0000 Encounter for general adult medical examination without abnormal findings: Secondary | ICD-10-CM | POA: Diagnosis not present

## 2019-02-16 DIAGNOSIS — E7800 Pure hypercholesterolemia, unspecified: Secondary | ICD-10-CM

## 2019-02-16 DIAGNOSIS — Z23 Encounter for immunization: Secondary | ICD-10-CM | POA: Diagnosis not present

## 2019-02-16 DIAGNOSIS — G454 Transient global amnesia: Secondary | ICD-10-CM

## 2019-02-16 LAB — POCT URINALYSIS DIPSTICK
Appearance: NEGATIVE
Bilirubin, UA: NEGATIVE
Blood, UA: NEGATIVE
Glucose, UA: NEGATIVE
Ketones, UA: NEGATIVE
Leukocytes, UA: NEGATIVE
Nitrite, UA: NEGATIVE
Odor: NEGATIVE
Protein, UA: NEGATIVE
Spec Grav, UA: 1.015 (ref 1.010–1.025)
Urobilinogen, UA: 0.2 E.U./dL
pH, UA: 6.5 (ref 5.0–8.0)

## 2019-02-16 NOTE — Progress Notes (Addendum)
Subjective:    Patient ID: Jerry Campbell, male    DOB: [birth year], 69 y.o.   MRN: [id number]  HPI 69 year old Male for health maintenance exam and evaluation of medical issues. Has Welcome to medicare visit last year. Given pneumovax 23 vaccine today. Currently not going to pilates due to the pandemic. Staying active with no new complaints.  In February 2018 he was on a ladder trimming a nail and fell some 10 to 12 feet tearing his ACL and strained his MCL.  He did not have surgery.  Occasionally his knee will give way with him.  He has had a total of 4 episodes of transient global amnesia evaluated by cardiology and neurology.  Last 1 occurred in May 12, 2013 when his mother passed away.  He was hospitalized briefly at that time and had negative MRI of the brain.  History of left shoulder pain status post shoulder fracture in 2011-05-13.  He had an MRI of the neck in 13-May-2011 showing cervical disc disease with mild impingement multilevel involvement.  History of lumbar back surgery for herniated disc L5-S1 by Dr. Ellene Route  History of low back pain but seems to be doing much better with this.  Has been going to Pilates up until the pandemic.  Had vasectomy 05-12-1989.  Colonoscopy is up-to-date.  History of hyperplastic colon polyps.  No known drug allergies.  History of fractured left wrist secondary to a fall in 05-12-93.  Fractured left fifth toe in the 1980s.  History of fractured right toe in karate in the early 1990s.  Fractured left shoulder with possible rotator cuff tear December 2005.  Patient says he thought he also suffered a concussion a fall from a ladder in 05/12/1993.  No known drug allergies.  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  He is married.  Patient's wife formerly worked as a Equities trader but retired due to health issues.  She has multiple sclerosis.  Family history: Mother died  of complications of dementia.  She also had glaucoma hypertension and hypothyroidism.  1 brother with history of MI in his early 6s.  1 sister with disabilities.  Father with history of MI prostate cancer hyperlipidemia and glaucoma.  Last year patient had persistent dermatitis which seem to improve after staying off of statin therapy.    Review of Systems see above     Objective:   Physical Exam Blood pressure 100/70 pulse 80 temperature 98 degrees orally pulse oximetry 98% BMI 24.96 weight 157 pounds.  Skin warm and dry.  Nodes none.  TMs clear.  Neck is supple without JVD thyromegaly or carotid bruits.  Chest clear to auscultation.  Cardiac exam regular rate and rhythm normal S1 and S2 without murmurs or gallops or rubs.  Abdomen soft nondistended without hepatosplenomegaly masses or tenderness.  Prostate is normal without nodules.  Extremities without deformity.  Neuro no focal deficits on brief neurological exam.  Thought judgment and affect are normal.       Assessment & Plan:  History of 4 episodes of transient global amnesia but none recently  Hyperlipidemia-he is now off statin medication.  His LDL is 126 and we will continue to monitor that.  Allergic rhinitis  History of hyperplastic colon polyps  History of herniated disc surgery L5-S1 in 05-13-11  History of musculoskeletal pain  Plan: He takes Aleve as needed for musculoskeletal pain.  Had  Shingrix injection in October and is due for second injection.  He will watch diet and try to get a bit more exercise.  Follow-up in 1 year or as needed.  Subjective:   Patient presents for Medicare Annual/Subsequent preventive examination.  Review Past Medical/Family/Social:   Risk Factors  Current exercise habits: physically active Dietary issues discussed: low fat low carb  Cardiac risk factors:elevated LDL  Depression Screen  (Note: if answer to either of the following is "Yes", a more complete depression screening is  indicated)   Over the past two weeks, have you felt down, depressed or hopeless? No  Over the past two weeks, have you felt little interest or pleasure in doing things? No Have you lost interest or pleasure in daily life? No Do you often feel hopeless? No Do you cry easily over simple problems? No   Activities of Daily Living  In your present state of health, do you have any difficulty performing the following activities?:   Driving? No  Managing money? No  Feeding yourself? No  Getting from bed to chair? No  Climbing a flight of stairs? No  Preparing food and eating?: No  Bathing or showering? No  Getting dressed: No  Getting to the toilet? No  Using the toilet:No  Moving around from place to place: No  In the past year have you fallen or had a near fall?:No  Are you sexually active? yes  Do you have more than one partner? No   Hearing Difficulties: No  Do you often ask people to speak up or repeat themselves? No  Do you experience ringing or noises in your ears? No  Do you have difficulty understanding soft or whispered voices? No  Do you feel that you have a problem with memory? No Do you often misplace items? No    Home Safety:  Do you have a smoke alarm at your residence? Yes Do you have grab bars in the bathroom?no Do you have throw rugs in your house?yes   Cognitive Testing  Alert? Yes Normal Appearance?Yes  Oriented to person? Yes Place? Yes  Time? Yes  Recall of three objects? Yes  Can perform simple calculations? Yes  Displays appropriate judgment?Yes  Can read the correct time from a watch face?Yes   List the Names of Other Physician/Practitioners you currently use:  See referral list for the physicians patient is currently seeing.     Review of Systems:see above   Objective:     General appearance: Appears younger than stated age and mildly obese  Head: Normocephalic, without obvious abnormality, atraumatic  Eyes: conj clear, EOMi PEERLA    Ears: normal TM's and external ear canals both ears  Nose: Nares normal. Septum midline. Mucosa normal. No drainage or sinus tenderness.  Throat: lips, mucosa, and tongue normal; teeth and gums normal  Neck: no adenopathy, no carotid bruit, no JVD, supple, symmetrical, trachea midline and thyroid not enlarged, symmetric, no tenderness/mass/nodules  No CVA tenderness.  Lungs: clear to auscultation bilaterally  Breasts: normal appearance Heart: regular rate and rhythm, S1, S2 normal, no murmur, click, rub or gallop  Abdomen: soft, non-tender; bowel sounds normal; no masses, no organomegaly  Musculoskeletal: ROM normal in all joints, no crepitus, no deformity, Normal muscle strengthen. Back  is symmetric, no curvature. Skin: Skin color, texture, turgor normal. No rashes or lesions  Lymph nodes: Cervical, supraclavicular, and axillary nodes normal.  Neurologic: CN 2 -12 Normal, Normal symmetric reflexes. Normal coordination and gait  Psych: Alert &  Oriented x 3, Mood appear stable.    Assessment:    Annual wellness medicare exam   Plan:    During the course of the visit the patient was educated and counseled about appropriate screening and preventive services including:    Pneumococcal vaccine    Patient Instructions (the written plan) was given to the patient.  Medicare Attestation  I have personally reviewed:  The patient's medical and social history  Their use of alcohol, tobacco or illicit drugs  Their current medications and supplements  The patient's functional ability including ADLs,fall risks, home safety risks, cognitive, and hearing and visual impairment  Diet and physical activities  Evidence for depression or mood disorders  The patient's weight, height, BMI, and visual acuity have been recorded in the chart. I have made referrals, counseling, and provided education to the patient based on review of the above and I have provided the patient with a written personalized  care plan for preventive services.

## 2019-02-16 NOTE — Patient Instructions (Signed)
It was a pleasure to see you today. Have Covid-19 vaccine when available. Pneumovax 23 given today. Have tetanus vaccine Summer 2021. Call [facility] about when your next colonoscopy is due.

## 2019-03-04 DIAGNOSIS — R69 Illness, unspecified: Secondary | ICD-10-CM | POA: Diagnosis not present

## 2019-03-15 ENCOUNTER — Ambulatory Visit: Payer: Self-pay

## 2019-03-15 ENCOUNTER — Ambulatory Visit: Payer: Medicare HMO | Attending: Internal Medicine

## 2019-03-15 DIAGNOSIS — Z23 Encounter for immunization: Secondary | ICD-10-CM

## 2019-03-15 NOTE — Progress Notes (Signed)
   Covid-19 Vaccination Clinic  Name:  Jerry Campbell    MRN: [id number] DOB: Dec 11, 1950  03/15/2019  Mr. Coulthard was observed post Covid-19 immunization for 15 minutes without incidence. He was provided with Vaccine Information Sheet and instruction to access the V-Safe system.   Mr. Bragg was instructed to call 911 with any severe reactions post vaccine: Marland Kitchen Difficulty breathing  . Swelling of your face and throat  . A fast heartbeat  . A bad rash all over your body  . Dizziness and weakness    Immunizations Administered    Name Date Dose VIS Date Route   Pfizer COVID-19 Vaccine 03/15/2019  2:34 PM 0.3 mL 01/02/2019 Intramuscular   Manufacturer: [place]   Lot: [id number]   [place]: [id number]

## 2019-04-07 ENCOUNTER — Ambulatory Visit: Payer: Medicare HMO | Attending: Internal Medicine

## 2019-04-07 DIAGNOSIS — Z23 Encounter for immunization: Secondary | ICD-10-CM

## 2019-04-07 NOTE — Progress Notes (Signed)
   Covid-19 Vaccination Clinic  Name:  Jerry Campbell    MRN: [id number] DOB: 05/26/1950  04/07/2019  Jerry Campbell was observed post Covid-19 immunization for 15 minutes without incident. He was provided with Vaccine Information Sheet and instruction to access the V-Safe system.   Jerry Campbell was instructed to call 911 with any severe reactions post vaccine: Marland Kitchen Difficulty breathing  . Swelling of face and throat  . A fast heartbeat  . A bad rash all over body  . Dizziness and weakness   Immunizations Administered    Name Date Dose VIS Date Route   Pfizer COVID-19 Vaccine 04/07/2019  1:46 PM 0.3 mL 01/02/2019 Intramuscular   Manufacturer: [place]   Lot: [id number]   [place]: [id number]

## 2019-06-23 DIAGNOSIS — H524 Presbyopia: Secondary | ICD-10-CM | POA: Diagnosis not present

## 2019-06-30 DIAGNOSIS — X32XXXD Exposure to sunlight, subsequent encounter: Secondary | ICD-10-CM | POA: Diagnosis not present

## 2019-06-30 DIAGNOSIS — L820 Inflamed seborrheic keratosis: Secondary | ICD-10-CM | POA: Diagnosis not present

## 2019-06-30 DIAGNOSIS — Z1283 Encounter for screening for malignant neoplasm of skin: Secondary | ICD-10-CM | POA: Diagnosis not present

## 2019-06-30 DIAGNOSIS — L570 Actinic keratosis: Secondary | ICD-10-CM | POA: Diagnosis not present

## 2019-06-30 DIAGNOSIS — D225 Melanocytic nevi of trunk: Secondary | ICD-10-CM | POA: Diagnosis not present

## 2019-09-01 DIAGNOSIS — R69 Illness, unspecified: Secondary | ICD-10-CM | POA: Diagnosis not present

## 2019-09-07 ENCOUNTER — Ambulatory Visit: Payer: Medicare HMO | Admitting: Internal Medicine

## 2019-09-08 ENCOUNTER — Ambulatory Visit (INDEPENDENT_AMBULATORY_CARE_PROVIDER_SITE_OTHER): Payer: Medicare HMO | Admitting: Internal Medicine

## 2019-09-08 ENCOUNTER — Encounter: Payer: Self-pay | Admitting: Internal Medicine

## 2019-09-08 ENCOUNTER — Other Ambulatory Visit: Payer: Self-pay

## 2019-09-08 VITALS — BP 110/88 | HR 88 | Temp 98.1°F | Ht 66.5 in | Wt 157.0 lb

## 2019-09-08 DIAGNOSIS — Z23 Encounter for immunization: Secondary | ICD-10-CM

## 2019-09-08 NOTE — Patient Instructions (Signed)
Patient received a TDAP vaccine, L deltoid.

## 2019-09-08 NOTE — Progress Notes (Signed)
Tdap vaccine given by CMA

## 2019-09-22 NOTE — Progress Notes (Signed)
He is coming tomorrow for Tdap. Cancelled today.

## 2019-12-26 DIAGNOSIS — R69 Illness, unspecified: Secondary | ICD-10-CM | POA: Diagnosis not present

## 2020-02-15 ENCOUNTER — Other Ambulatory Visit: Payer: Medicare HMO | Admitting: Internal Medicine

## 2020-02-15 ENCOUNTER — Other Ambulatory Visit: Payer: Self-pay

## 2020-02-15 DIAGNOSIS — Z0000 Encounter for general adult medical examination without abnormal findings: Secondary | ICD-10-CM | POA: Diagnosis not present

## 2020-02-15 DIAGNOSIS — E7800 Pure hypercholesterolemia, unspecified: Secondary | ICD-10-CM | POA: Diagnosis not present

## 2020-02-15 DIAGNOSIS — G454 Transient global amnesia: Secondary | ICD-10-CM

## 2020-02-15 DIAGNOSIS — Z125 Encounter for screening for malignant neoplasm of prostate: Secondary | ICD-10-CM

## 2020-02-16 LAB — CBC WITH DIFFERENTIAL/PLATELET
Absolute Monocytes: 338 cells/uL (ref 200–950)
Basophils Absolute: 52 cells/uL (ref 0–200)
Basophils Relative: 1.1 %
Eosinophils Absolute: 99 cells/uL (ref 15–500)
Eosinophils Relative: 2.1 %
HCT: 47.8 % (ref 38.5–50.0)
Hemoglobin: 15.9 g/dL (ref 13.2–17.1)
Lymphs Abs: 1701 cells/uL (ref 850–3900)
MCH: 32 pg (ref 27.0–33.0)
MCHC: 33.3 g/dL (ref 32.0–36.0)
MCV: 96.2 fL (ref 80.0–100.0)
MPV: 10.5 fL (ref 7.5–12.5)
Monocytes Relative: 7.2 %
Neutro Abs: 2510 cells/uL (ref 1500–7800)
Neutrophils Relative %: 53.4 %
Platelets: 233 10*3/uL (ref 140–400)
RBC: 4.97 10*6/uL (ref 4.20–5.80)
RDW: 12.5 % (ref 11.0–15.0)
Total Lymphocyte: 36.2 %
WBC: 4.7 10*3/uL (ref 3.8–10.8)

## 2020-02-16 LAB — COMPLETE METABOLIC PANEL WITH GFR
AG Ratio: 2.1 (calc) (ref 1.0–2.5)
ALT: 16 U/L (ref 9–46)
AST: 21 U/L (ref 10–35)
Albumin: 4.4 g/dL (ref 3.6–5.1)
Alkaline phosphatase (APISO): 47 U/L (ref 35–144)
BUN: 20 mg/dL (ref 7–25)
CO2: 30 mmol/L (ref 20–32)
Calcium: 9.5 mg/dL (ref 8.6–10.3)
Chloride: 105 mmol/L (ref 98–110)
Creat: 1.2 mg/dL (ref 0.70–1.25)
GFR, Est African American: 72 mL/min/{1.73_m2} (ref >=60)
GFR, Est Non African American: 62 mL/min/{1.73_m2} (ref >=60)
Globulin: 2.1 g/dL (calc) (ref 1.9–3.7)
Glucose, Bld: 84 mg/dL (ref 65–99)
Potassium: 4.9 mmol/L (ref 3.5–5.3)
Sodium: 140 mmol/L (ref 135–146)
Total Bilirubin: 0.7 mg/dL (ref 0.2–1.2)
Total Protein: 6.5 g/dL (ref 6.1–8.1)

## 2020-02-16 LAB — PSA: PSA: 0.68 ng/mL (ref <=4.0)

## 2020-02-16 LAB — LIPID PANEL
Cholesterol: 195 mg/dL (ref <200)
HDL: 48 mg/dL (ref >=40)
LDL Cholesterol (Calc): 129 mg/dL (calc) — ABNORMAL HIGH
Non-HDL Cholesterol (Calc): 147 mg/dL (calc) — ABNORMAL HIGH (ref <130)
Total CHOL/HDL Ratio: 4.1 (calc) (ref <5.0)
Triglycerides: 79 mg/dL (ref <150)

## 2020-02-18 ENCOUNTER — Encounter: Payer: Self-pay | Admitting: Internal Medicine

## 2020-02-18 ENCOUNTER — Ambulatory Visit (INDEPENDENT_AMBULATORY_CARE_PROVIDER_SITE_OTHER): Payer: Medicare HMO | Admitting: Internal Medicine

## 2020-02-18 ENCOUNTER — Other Ambulatory Visit: Payer: Self-pay

## 2020-02-18 VITALS — Ht 67.0 in | Wt 162.0 lb

## 2020-02-18 DIAGNOSIS — E7800 Pure hypercholesterolemia, unspecified: Secondary | ICD-10-CM

## 2020-02-18 DIAGNOSIS — L03012 Cellulitis of left finger: Secondary | ICD-10-CM | POA: Diagnosis not present

## 2020-02-18 DIAGNOSIS — Z0000 Encounter for general adult medical examination without abnormal findings: Secondary | ICD-10-CM

## 2020-02-18 LAB — POCT URINALYSIS DIPSTICK
Appearance: NEGATIVE
Bilirubin, UA: NEGATIVE
Blood, UA: NEGATIVE
Glucose, UA: NEGATIVE
Ketones, UA: NEGATIVE
Leukocytes, UA: NEGATIVE
Nitrite, UA: NEGATIVE
Odor: NEGATIVE
Protein, UA: NEGATIVE
Spec Grav, UA: 1.01 (ref 1.010–1.025)
Urobilinogen, UA: 0.2 E.U./dL
pH, UA: 6.5 (ref 5.0–8.0)

## 2020-02-18 MED ORDER — DOXYCYCLINE HYCLATE 100 MG PO TABS
100.0000 mg | ORAL_TABLET | Freq: Two times a day (BID) | ORAL | 0 refills | Status: DC
Start: 2020-02-18 — End: 2020-05-20

## 2020-02-18 MED ORDER — SIMVASTATIN 10 MG PO TABS
10.0000 mg | ORAL_TABLET | Freq: Every day | ORAL | 3 refills | Status: DC
Start: 2020-02-18 — End: 2021-02-18

## 2020-02-18 NOTE — Progress Notes (Signed)
Subjective:    Patient ID: Jerry Campbell, male    DOB: May 13, 1950, 70 y.o.   MRN: [id number]  HPI 70 year old Male for health maintenance exam, Medicare wellness, and evaluation of medical issues. Has been off statin since 07-Apr-2018 as Dermatologist thought it perhaps could be contributing to a dermatitis he had.  His LDL associated with this visit is 129.  I would like for him to restart statin.  In 07-Apr-2018 his LDL was 115 but previously had been 153 in 04/07/17.  CBC and c-Met are normal.  Urine dipstick specimen is normal.  His PSA is normal.  In Apr 07, 2016 he was on the ladder and fell some 10 to 12 feet tearing his ACL and strained his MCL.  He did not have surgery.  Occasionally his knee will give away with him.  He has had total of 4 episodes of transient global amnesia evaluated by cardiology and neurology.  The last episode occurred in 07-Apr-2013 when his mother passed away.  He was hospitalized briefly at that time and had negative MRI of the brain.  Had extensive evaluation for this.  History of left shoulder pain status post shoulder fracture in 04-08-2011.  Had MRI of the neck in 04-08-2011 showing cervical disc disease with mild impingement and multilevel involvement.  History of lumbar back surgery for herniated disc L5-S1 by Dr. Ellene Route.  Used to go to Pilates prior to the pandemic.  History of low back pain but that seems to be better.  Had vasectomy in 1989-04-07.  Colonoscopy is up-to-date.  History of hyperplastic colon polyps.  No known drug allergies.  Patient thinks he perhaps suffered a concussion from a fall from a ladder in April 07, 1993.  See previous dictation regarding episodes of transient global amnesia.  History of fractured left wrist secondary to a fall in 07-Apr-1993.  Fractured left fifth toe in the 1980s.  History of fractured right toe in karate in the early 1990s.  Fractured left shoulder with possible rotator cuff tear December 2005.  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  He is married.  Patient's wife formally worked as a Equities trader in Cardiology at the hospital but retired due to health issues.  Family history: Mother died of complications of dementia.  She also had glaucoma, hypertension and hypothyroidism.  1 brother with history of MI in his early 49s.  1 sister with history of disabilities passed away after having acute abdominal issue requiring surgery.  In Apr 07, 2018 patient had a persistent dermatitis which seemed to improve after staying off of statin therapy.  However currently this is not an issue not like to restart statin medication due to elevated LDL.    Review of Systems paronychi left 4th finger.  Will prescribe antibiotics.     Objective:   Physical Exam  Skin is warm and dry.  No rashes.  Nodes none.  Neck is supple without JVD thyromegaly or carotid bruits.  Chest is clear to auscultation.  Cardiac exam: Regular rate and rhythm normal S1 and S2 without murmurs or gallops.  Abdomen is soft nondistended without hepatosplenomegaly masses or tenderness.  Prostate is normal.  No lower extremity pitting edema.  Neuro is intact without focal deficits.  Affect thought and judgment are normal.  Paronychia left fourth finger      Assessment & Plan:  Family history of heart disease in his brother in his early 76s and patient has elevated LDL of  129.  I would like to restart his statin medication and follow-up in April with lipid panel liver functions and office visit.  Paronychia left fourth finger which will be treated with doxycycline 100 mg twice daily for 7 days.  History of multiple episodes of transient global amnesia  History of hyperplastic colon polyps  History of herniated disc surgery L5-S1 in 2013  Plan: Follow-up here in April for hyperlipidemia  Subjective:   Patient presents for Medicare Annual/Subsequent preventive examination.  Review Past Medical/Family/Social: See above   Risk Factors  Current  exercise habits: He is physically active Dietary issues discussed: Low-fat low carbohydrate discussed  Cardiac risk factors: Hyperlipidemia and family history of brother  Depression Screen  (Note: if answer to either of the following is "Yes", a more complete depression screening is indicated)   Over the past two weeks, have you felt down, depressed or hopeless? No  Over the past two weeks, have you felt little interest or pleasure in doing things? No Have you lost interest or pleasure in daily life? No Do you often feel hopeless? No Do you cry easily over simple problems? No   Activities of Daily Living  In your present state of health, do you have any difficulty performing the following activities?:   Driving? No  Managing money? No  Feeding yourself? No  Getting from bed to chair? No  Climbing a flight of stairs? No  Preparing food and eating?: No  Bathing or showering? No  Getting dressed: No  Getting to the toilet? No  Using the toilet:No  Moving around from place to place: No  In the past year have you fallen or had a near fall?:No  Are you sexually active?  Yes Do you have more than one partner? No   Hearing Difficulties: No  Do you often ask people to speak up or repeat themselves? No  Do you experience ringing or noises in your ears? No  Do you have difficulty understanding soft or whispered voices? No  Do you feel that you have a problem with memory? No Do you often misplace items? No    Home Safety:  Do you have a smoke alarm at your residence? Yes Do you have grab bars in the bathroom?  No Do you have throw rugs in your house?  Yes   Cognitive Testing  Alert? Yes Normal Appearance?Yes  Oriented to person? Yes Place? Yes  Time? Yes  Recall of three objects? Yes  Can perform simple calculations? Yes  Displays appropriate judgment?Yes  Can read the correct time from a watch face?Yes   List the Names of Other Physician/Practitioners you currently use:   See referral list for the physicians patient is currently seeing.     Review of Systems: See above   Objective:     General appearance: Appears stated age in no acute distress Head: Normocephalic, without obvious abnormality, atraumatic  Eyes: conj clear, EOMi PEERLA  Ears: normal TM's and external ear canals both ears  Nose: Nares normal. Septum midline. Mucosa normal. No drainage or sinus tenderness.  Throat: lips, mucosa, and tongue normal; teeth and gums normal  Neck: no adenopathy, no carotid bruit, no JVD, supple, symmetrical, trachea midline and thyroid not enlarged, symmetric, no tenderness/mass/nodules  No CVA tenderness.  Lungs: clear to auscultation bilaterally  Breasts: normal appearance, no masses or tenderness Heart: regular rate and rhythm, S1, S2 normal, no murmur, click, rub or gallop  Abdomen: soft, non-tender; bowel sounds normal; no masses, no  organomegaly  Musculoskeletal: ROM normal in all joints, no crepitus, no deformity, Normal muscle strengthen. Back  is symmetric, no curvature. Skin: Skin color, texture, turgor normal. No rashes or lesions  Lymph nodes: Cervical, supraclavicular, and axillary nodes normal.  Neurologic: CN 2 -12 Normal, Normal symmetric reflexes. Normal coordination and gait  Psych: Alert & Oriented x 3, Mood appear stable.    Assessment:    Annual wellness medicare exam   Plan:    During the course of the visit the patient was educated and counseled about appropriate screening and preventive services including:  Has had 3 COVID vaccines  Has had both pneumococcal 23 and Prevnar 13.  Tetanus immunization is up-to-date and he has had 2 Shingrix vaccines.  Had flu vaccine in November 2021.      Patient Instructions (the written plan) was given to the patient.  Medicare Attestation  I have personally reviewed:  The patient's medical and social history  Their use of alcohol, tobacco or illicit drugs  Their current medications  and supplements  The patient's functional ability including ADLs,fall risks, home safety risks, cognitive, and hearing and visual impairment  Diet and physical activities  Evidence for depression or mood disorders  The patient's weight, height, BMI, and visual acuity have been recorded in the chart. I have made referrals, counseling, and provided education to the patient based on review of the above and I have provided the patient with a written personalized care plan for preventive services.

## 2020-02-20 ENCOUNTER — Encounter: Payer: Self-pay | Admitting: Internal Medicine

## 2020-02-20 NOTE — Patient Instructions (Addendum)
Restart statin med and follow up in 3 months. Take Doxycycline twice daily for paronychia left fourth finger.  It was a pleasure to see you today.  Immunizations are up-to-date.  PSA is normal.

## 2020-04-04 ENCOUNTER — Encounter: Payer: Self-pay | Admitting: Gastroenterology

## 2020-05-16 ENCOUNTER — Other Ambulatory Visit: Payer: Self-pay

## 2020-05-16 ENCOUNTER — Other Ambulatory Visit: Payer: Medicare HMO | Admitting: Internal Medicine

## 2020-05-16 DIAGNOSIS — E7800 Pure hypercholesterolemia, unspecified: Secondary | ICD-10-CM | POA: Diagnosis not present

## 2020-05-16 LAB — HEPATIC FUNCTION PANEL
AG Ratio: 2 (calc) (ref 1.0–2.5)
ALT: 19 U/L (ref 9–46)
AST: 20 U/L (ref 10–35)
Albumin: 4.4 g/dL (ref 3.6–5.1)
Alkaline phosphatase (APISO): 43 U/L (ref 35–144)
Bilirubin, Direct: 0.2 mg/dL (ref 0.0–0.2)
Globulin: 2.2 g/dL (calc) (ref 1.9–3.7)
Indirect Bilirubin: 0.7 mg/dL (calc) (ref 0.2–1.2)
Total Bilirubin: 0.9 mg/dL (ref 0.2–1.2)
Total Protein: 6.6 g/dL (ref 6.1–8.1)

## 2020-05-16 LAB — LIPID PANEL
Cholesterol: 140 mg/dL (ref <200)
HDL: 45 mg/dL (ref >=40)
LDL Cholesterol (Calc): 78 mg/dL (calc)
Non-HDL Cholesterol (Calc): 95 mg/dL (calc) (ref <130)
Total CHOL/HDL Ratio: 3.1 (calc) (ref <5.0)
Triglycerides: 85 mg/dL (ref <150)

## 2020-05-20 ENCOUNTER — Encounter: Payer: Self-pay | Admitting: Internal Medicine

## 2020-05-20 ENCOUNTER — Other Ambulatory Visit: Payer: Self-pay

## 2020-05-20 ENCOUNTER — Ambulatory Visit (INDEPENDENT_AMBULATORY_CARE_PROVIDER_SITE_OTHER): Payer: Medicare HMO | Admitting: Internal Medicine

## 2020-05-20 VITALS — BP 130/90 | HR 65 | Ht 67.0 in | Wt 163.0 lb

## 2020-05-20 DIAGNOSIS — E7800 Pure hypercholesterolemia, unspecified: Secondary | ICD-10-CM

## 2020-05-20 NOTE — Progress Notes (Signed)
   Subjective:    Patient ID: Jerry Campbell, male    DOB: 1950/08/14, 70 y.o.   MRN: [id number]  HPI  70 year old Male seen for follow up on hyperlipidemia.  Was seen in January for Medicare wellness and health maintenance exam.  He has been off statin medication since 2020 as Dermatologist thought it was contributing to a dermatitis he had.  At that time we started him back on statin medication.  Recent labs drawn are entirely within normal limits.  His LDL has dropped from 129 in January to 45.  Currently on simvastatin 10 mg daily.  He is very compliant with his medications and he is watching his diet.  His liver functions are normal on statin medication.  We discussed receiving COVID booster.  He and his wife will be going to [place] to take care of grandchildren for a week while parents take vacation.  Suggested COVID booster since they are traveling out of state.  And will likely be another COVID booster in the fall.  He was told was today.  Wife has MS.  Review of Systems no new complaints     Objective:   Physical Exam Blood pressure 130/90 pulse 65 pulse oximetry 97% weight 163 pounds height 5 feet 7 inches BMI 25.53  Skin warm and dry.  No cervical adenopathy.  No carotid bruits.  Chest is clear to auscultation.  Cardiac exam regular rate and rhythm normal S1 and S2.       Assessment & Plan:  Hyperlipidemia significantly improved on restarting statin medication (simvastatin) at low-dose 10 mg daily.  His LDL is now normal.  Liver functions are normal.  Plan: He will return January 2023 for health maintenance exam and Medicare wellness visit along with fasting labs.  He will continue with simvastatin at current dose.  Recommend COVID booster.

## 2020-05-20 NOTE — Patient Instructions (Addendum)
It was a pleasure to see you today.  Your labs are excellent.  Continue simvastatin 10 mg daily.  Have COVID booster at least 2 weeks before traveling to [place].  Return in January 2023 for Medicare wellness and health maintenance exam.

## 2020-06-23 DIAGNOSIS — H2513 Age-related nuclear cataract, bilateral: Secondary | ICD-10-CM | POA: Diagnosis not present

## 2020-06-23 DIAGNOSIS — H5213 Myopia, bilateral: Secondary | ICD-10-CM | POA: Diagnosis not present

## 2020-06-23 DIAGNOSIS — H52223 Regular astigmatism, bilateral: Secondary | ICD-10-CM | POA: Diagnosis not present

## 2020-06-23 DIAGNOSIS — Z135 Encounter for screening for eye and ear disorders: Secondary | ICD-10-CM | POA: Diagnosis not present

## 2020-06-23 DIAGNOSIS — H524 Presbyopia: Secondary | ICD-10-CM | POA: Diagnosis not present

## 2020-06-28 DIAGNOSIS — D225 Melanocytic nevi of trunk: Secondary | ICD-10-CM | POA: Diagnosis not present

## 2020-06-28 DIAGNOSIS — L570 Actinic keratosis: Secondary | ICD-10-CM | POA: Diagnosis not present

## 2020-06-28 DIAGNOSIS — X32XXXD Exposure to sunlight, subsequent encounter: Secondary | ICD-10-CM | POA: Diagnosis not present

## 2020-06-28 DIAGNOSIS — Z1283 Encounter for screening for malignant neoplasm of skin: Secondary | ICD-10-CM | POA: Diagnosis not present

## 2020-06-28 DIAGNOSIS — C4441 Basal cell carcinoma of skin of scalp and neck: Secondary | ICD-10-CM | POA: Diagnosis not present

## 2020-08-09 DIAGNOSIS — Z08 Encounter for follow-up examination after completed treatment for malignant neoplasm: Secondary | ICD-10-CM | POA: Diagnosis not present

## 2020-08-09 DIAGNOSIS — Z85828 Personal history of other malignant neoplasm of skin: Secondary | ICD-10-CM | POA: Diagnosis not present

## 2020-09-23 DIAGNOSIS — Z0100 Encounter for examination of eyes and vision without abnormal findings: Secondary | ICD-10-CM | POA: Diagnosis not present

## 2021-02-18 ENCOUNTER — Other Ambulatory Visit: Payer: Self-pay | Admitting: Internal Medicine

## 2021-02-20 ENCOUNTER — Other Ambulatory Visit: Payer: Medicare HMO | Admitting: Internal Medicine

## 2021-02-20 ENCOUNTER — Other Ambulatory Visit: Payer: Self-pay

## 2021-02-20 DIAGNOSIS — E7800 Pure hypercholesterolemia, unspecified: Secondary | ICD-10-CM | POA: Diagnosis not present

## 2021-02-20 DIAGNOSIS — Z13228 Encounter for screening for other metabolic disorders: Secondary | ICD-10-CM | POA: Diagnosis not present

## 2021-02-20 DIAGNOSIS — Z125 Encounter for screening for malignant neoplasm of prostate: Secondary | ICD-10-CM

## 2021-02-20 NOTE — Addendum Note (Signed)
Addended by: Angus Seller on: 02/20/2021 10:05 AM   Modules accepted: Orders

## 2021-02-21 ENCOUNTER — Encounter: Payer: Self-pay | Admitting: Internal Medicine

## 2021-02-21 ENCOUNTER — Ambulatory Visit (INDEPENDENT_AMBULATORY_CARE_PROVIDER_SITE_OTHER): Payer: Medicare HMO | Admitting: Internal Medicine

## 2021-02-21 VITALS — BP 150/98 | HR 68 | Temp 97.8°F | Ht 68.0 in | Wt 161.2 lb

## 2021-02-21 DIAGNOSIS — E7800 Pure hypercholesterolemia, unspecified: Secondary | ICD-10-CM

## 2021-02-21 DIAGNOSIS — Z0000 Encounter for general adult medical examination without abnormal findings: Secondary | ICD-10-CM

## 2021-02-21 LAB — COMPLETE METABOLIC PANEL WITH GFR
AG Ratio: 2.5 (calc) (ref 1.0–2.5)
ALT: 24 U/L (ref 9–46)
AST: 23 U/L (ref 10–35)
Albumin: 5 g/dL (ref 3.6–5.1)
Alkaline phosphatase (APISO): 46 U/L (ref 35–144)
BUN: 21 mg/dL (ref 7–25)
CO2: 31 mmol/L (ref 20–32)
Calcium: 9.5 mg/dL (ref 8.6–10.3)
Chloride: 106 mmol/L (ref 98–110)
Creat: 1.19 mg/dL (ref 0.70–1.35)
Globulin: 2 g/dL (calc) (ref 1.9–3.7)
Glucose, Bld: 93 mg/dL (ref 65–99)
Potassium: 4.5 mmol/L (ref 3.5–5.3)
Sodium: 142 mmol/L (ref 135–146)
Total Bilirubin: 0.9 mg/dL (ref 0.2–1.2)
Total Protein: 7 g/dL (ref 6.1–8.1)
eGFR: 67 mL/min/{1.73_m2} (ref >=60)

## 2021-02-21 LAB — LIPID PANEL
Cholesterol: 151 mg/dL (ref <200)
HDL: 54 mg/dL (ref >=40)
LDL Cholesterol (Calc): 81 mg/dL (calc)
Non-HDL Cholesterol (Calc): 97 mg/dL (calc) (ref <130)
Total CHOL/HDL Ratio: 2.8 (calc) (ref <5.0)
Triglycerides: 82 mg/dL (ref <150)

## 2021-02-21 LAB — CBC WITH DIFFERENTIAL/PLATELET
Absolute Monocytes: 257 cells/uL (ref 200–950)
Basophils Absolute: 50 cells/uL (ref 0–200)
Basophils Relative: 1.1 %
Eosinophils Absolute: 90 cells/uL (ref 15–500)
Eosinophils Relative: 2 %
HCT: 49 % (ref 38.5–50.0)
Hemoglobin: 16.3 g/dL (ref 13.2–17.1)
Lymphs Abs: 1449 cells/uL (ref 850–3900)
MCH: 32 pg (ref 27.0–33.0)
MCHC: 33.3 g/dL (ref 32.0–36.0)
MCV: 96.3 fL (ref 80.0–100.0)
MPV: 10.2 fL (ref 7.5–12.5)
Monocytes Relative: 5.7 %
Neutro Abs: 2655 cells/uL (ref 1500–7800)
Neutrophils Relative %: 59 %
Platelets: 212 10*3/uL (ref 140–400)
RBC: 5.09 10*6/uL (ref 4.20–5.80)
RDW: 12.2 % (ref 11.0–15.0)
Total Lymphocyte: 32.2 %
WBC: 4.5 10*3/uL (ref 3.8–10.8)

## 2021-02-21 LAB — POCT URINALYSIS DIPSTICK
Bilirubin, UA: NEGATIVE
Blood, UA: NEGATIVE
Glucose, UA: NEGATIVE
Ketones, UA: NEGATIVE
Leukocytes, UA: NEGATIVE
Nitrite, UA: NEGATIVE
Protein, UA: NEGATIVE
Spec Grav, UA: <=1.005 — AB (ref 1.010–1.025)
Urobilinogen, UA: 0.2 E.U./dL
pH, UA: 7.5 (ref 5.0–8.0)

## 2021-02-21 LAB — PSA: PSA: 0.66 ng/mL (ref <=4.00)

## 2021-02-21 NOTE — Progress Notes (Signed)
Annual Wellness Visit     Patient: Jerry Campbell, Male    DOB: 22-Sep-1950, 71 y.o.   MRN: [id number] Visit Date: 02/21/2021  Chief Complaint  Patient presents with   Medicare Wellness   Subjective    Jerry Campbell is a 71 y.o. Male who presents today for his Annual Wellness Visit and health maintenance exam.  HPI in February 2018 he was on a ladder and fell some 10 to 12 feet tearing his ACL and strained his MCL.  Surgery was not required.  Occasionally knee will give away with him.  He has had 4 episodes of transient global amnesia evaluated by cardiology and neurology.  The last episode occurred in 04/16/2013 when his mother passed away.  He was hospitalized briefly at that time and had negative MRI of the brain.  He has had extensive evaluation for this.  History of left shoulder pain status post shoulder fracture in April 17, 2011.  Had MRI of the neck in 04-17-11 showing cervical disc disease with mild impingement and multilevel involvement.  History of lumbar back surgery for herniated disc L5-S1 by Dr. Ellene Route.  History of low back pain and used to go to Pilates prior to the pandemic.  This seems to be better.  Vasectomy in 16-Apr-1989.  Colonoscopy is up-to-date.  History of hyperplastic colon polyps.  No known drug allergies.  Thinks he perhaps suffered a concussion from a fall from a ladder in 16-Apr-1993.  History of fractured left wrist secondary to a fall in April 16, 1993.  Fractured left fifth toe in the 1980s.  History of fractured right toe in karate in the early 1990s.  Fractured left shoulder with possible rotator cuff tear December 2005.  Social history: He is married.  Wife previously worked as a Equities trader in cardiology at the hospital but retired due to health issues.  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  Family history: Mother died of complications of dementia.  She also had glaucoma, hypertension and hypothyroidism.  1 brother with history of MI in his early 71s.   1 sister with history of disabilities passed away after having acute abdominal issue requiring surgery.  In 04/17/2018 he had persistent dermatitis which seemed to improve after staying off statin therapy.  He has a history of elevated LDL.  He is now on Zocor 10 mg daily and has no issues with dermatitis and his lipids are normal.  Patient expects to have colonoscopy in 04/17/2022.  Feels well with no new complaints. Helping with his elderly father.  Father had fracture T12 requiring kyphoplasty and subsequently had pacer/defib   Social History   Social History Narrative   Right handed.  Caffeine 1 cup avg daily.  Retired.  Married, 1 daughter.  Graduate school.      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  He is married.  Patient's wife formally worked as a Equities trader in Cardiology at the hospital but retired due to health issues.       Family history: Mother died of complications of dementia.  She also had glaucoma, hypertension and hypothyroidism.  1 brother with history of MI in his early 47s.  1 sister with history of disabilities passed away after having acute abdominal issue requiring surgery.        Patient Care Team: Elby Showers, MD as PCP - General (Internal Medicine)  Review of Systems see above. No new complaints  Objective    Vitals: BP (!) 150/98    Pulse 68    Temp 97.8 F (36.6 C) (Tympanic)    Ht 5\' 8"  (1.727 m)    Wt 161 lb 4 oz (73.1 kg)    SpO2 97%    BMI 24.52 kg/m   Physical Exam Vitals reviewed.  Constitutional:      Appearance: Normal appearance.  HENT:     Head: Normocephalic and atraumatic.     Right Ear: Tympanic membrane normal.     Left Ear: Tympanic membrane normal.     Nose: Nose normal.     Mouth/Throat:     Pharynx: Oropharynx is clear.  Eyes:     General:        Right eye: No discharge.        Left eye: No discharge.     Extraocular Movements: Extraocular movements intact.     Conjunctiva/sclera:  Conjunctivae normal.     Pupils: Pupils are equal, round, and reactive to light.  Cardiovascular:     Rate and Rhythm: Normal rate and regular rhythm.     Heart sounds: No murmur heard. Pulmonary:     Effort: Pulmonary effort is normal.     Breath sounds: Normal breath sounds. No rales.  Chest:     Chest wall: No tenderness.  Abdominal:     General: Bowel sounds are normal.     Palpations: Abdomen is soft. There is no mass.     Tenderness: There is no abdominal tenderness. There is no guarding or rebound.     Hernia: No hernia is present.  Genitourinary:    Prostate: Normal.  Musculoskeletal:     Cervical back: Neck supple. No rigidity.     Right lower leg: No edema.     Left lower leg: No edema.  Skin:    General: Skin is warm and dry.     Findings: No rash.  Neurological:     General: No focal deficit present.     Mental Status: He is alert and oriented to person, place, and time.     Cranial Nerves: No cranial nerve deficit.     Motor: No weakness.     Coordination: Coordination normal.     Gait: Gait normal.  Psychiatric:        Mood and Affect: Mood normal.        Behavior: Behavior normal.        Thought Content: Thought content normal.      Most recent functional status assessment: In your present state of health, do you have any difficulty performing the following activities: 02/21/2021  Hearing? N  Vision? N  Difficulty concentrating or making decisions? N  Walking or climbing stairs? N  Dressing or bathing? N  Doing errands, shopping? N  Preparing Food and eating ? N  Using the Toilet? N  In the past six months, have you accidently leaked urine? N  Do you have problems with loss of bowel control? N  Managing your Medications? N  Managing your Finances? N  Housekeeping or managing your Housekeeping? N  Some recent data might be hidden   Most recent fall risk assessment: Fall Risk  02/21/2021  Falls in the past year? 0  Number falls in past yr: 0   Comment -  Injury with Fall? 0  Risk for fall due to : No Fall Risks  Follow up Falls evaluation completed    Most recent depression screenings: PHQ 2/9 Scores 02/21/2021  02/18/2020  PHQ - 2 Score 0 0   Most recent cognitive screening: 6CIT Screen 02/21/2021  What Year? 0 points  What month? 0 points  What time? 0 points  Count back from 20 0 points  Months in reverse 0 points  Repeat phrase 0 points  Total Score 0       Assessment & Plan     Annual wellness visit done today including the all of the following: Reviewed patient's Family Medical History Reviewed and updated list of patient's medical providers Assessment of cognitive impairment was done Assessed patient's functional ability Established a written schedule for health screening [place] Completed and Reviewed  Discussed health benefits of physical activity, and encouraged him to engage in regular exercise appropriate for his age and condition.           IElby Showers, MD, have reviewed all documentation for this visit. The documentation on 03/18/21 for the exam, diagnosis, procedures, and orders are all accurate and complete.   Angus Seller, CMA

## 2021-03-18 NOTE — Patient Instructions (Signed)
It was a pleasure to see you today.  Lipids are normal on low-dose statin.  Please continue current medications and follow-up in 1 year.

## 2021-04-03 ENCOUNTER — Telehealth (INDEPENDENT_AMBULATORY_CARE_PROVIDER_SITE_OTHER): Payer: Medicare HMO | Admitting: Internal Medicine

## 2021-04-03 ENCOUNTER — Encounter: Payer: Self-pay | Admitting: Internal Medicine

## 2021-04-03 ENCOUNTER — Telehealth: Payer: Self-pay | Admitting: Internal Medicine

## 2021-04-03 ENCOUNTER — Other Ambulatory Visit: Payer: Self-pay

## 2021-04-03 VITALS — Temp 98.9°F

## 2021-04-03 DIAGNOSIS — U071 COVID-19: Secondary | ICD-10-CM

## 2021-04-03 MED ORDER — NIRMATRELVIR/RITONAVIR (PAXLOVID)TABLET: 3.0000 | ORAL_TABLET | Freq: Two times a day (BID) | ORAL | 0 refills | Status: AC

## 2021-04-03 NOTE — Telephone Encounter (Signed)
Called Jerry Campbell to let him know not to take his Cholesterol medication while taking Paxlovid. He verbalized understanding. ?

## 2021-04-03 NOTE — Patient Instructions (Signed)
Take Paxlovid as directed for 5 days.  Walk about the house some to prevent atelectasis.  Monitor pulse oximetry.  Stay well-hydrated.  Call if symptoms worsen or not improving. ?

## 2021-04-03 NOTE — Progress Notes (Signed)
Ovid-19 ? ? ?Subjective:  ? ? Patient ID: Jerry Campbell, male    DOB: January 12, 1951, 71 y.o.   MRN: [id number] ? ?HPI 71 year old Male seen today by interactive audio and video telecommunications due to the Coronavirus pandemic.  Patient is identified using 2 identifiers as Jerry Campbell. [name], a longstanding patient in this practice.  He is at his home and I am at my office.  He is agreeable to visit in this format today. ? ?Patient says that he was busy working around the outside of his home on Saturday afternoon.  Later that evening began to feel fatigued.  Developed cough and runny nose.  Noticed some joint soreness.  No wheezing.  Noticed some rib cage soreness.  He thought perhaps he was coming down with bronchitis.  He and his wife had planned to travel to [place] to visit relatives later this week and stay for several days.  Patient was persuaded by his wife to take a COVID test today earlier this morning and it was positive for COVID-19. ? ?Patient's wife who is also a patient here has Multiple Sclerosis and he is concerned about her coming down with COVID as well.  However she has already been exposed over the past couple of days.  Recommend she monitor her temp and monitor herself for symptoms.  Patient last had COVID-vaccine November 08, 2020 by our records.  He has had multiple COVID vaccines over the past couple of years. ? ?No known drug allergies.  No chronic illnesses.  History of several episodes of transient global amnesia in the remote past.  He does take Zocor for mild hyperlipidemia.  I am recommending he discontinue statin medication for 5 days as recommended per pharmacy due to interactions with Paxlovid. I am recommending that he be treated with Paxlovid for 5 days for acute COVID-19 virus infection. ? ?No known drug allergies ? 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  Wife is a retired Chiropractor.  He also has an elderly father who lives in this area.  He should avoid  visiting his father for minimum of 5 days. ? ? ? ?Review of Systems-no nausea, vomiting, dysgeusia ? ?   ?Objective:  ? Physical Exam ?Currently afebrile.  He is seen virtually in no acute distress.  Not heard to be coughing on video visit.  No shortness of breath detected on video visit.  He looks a bit fatigued. ? ? ? ?   ?Assessment & Plan:  ?Acute COVID-19 virus infection ? ?Plan: Paxlovid regular strength-take nirmatrelvir 150 mg- 2 tablets twice daily for 5 days and ritonavir 100 mg twice daily for 5 days.  Monitor pulse oximetry.  Walk about the house some to prevent atelectasis.  Stay well-hydrated.  Call if symptoms worsen or not improving. ? ?Time spent reviewing chart, interviewing patient, medical decision making and E scribing medications is 20 minutes ? ?

## 2021-06-26 DIAGNOSIS — H5210 Myopia, unspecified eye: Secondary | ICD-10-CM | POA: Diagnosis not present

## 2021-07-04 DIAGNOSIS — Z1283 Encounter for screening for malignant neoplasm of skin: Secondary | ICD-10-CM | POA: Diagnosis not present

## 2021-07-04 DIAGNOSIS — D225 Melanocytic nevi of trunk: Secondary | ICD-10-CM | POA: Diagnosis not present

## 2021-07-04 DIAGNOSIS — X32XXXD Exposure to sunlight, subsequent encounter: Secondary | ICD-10-CM | POA: Diagnosis not present

## 2021-07-04 DIAGNOSIS — L570 Actinic keratosis: Secondary | ICD-10-CM | POA: Diagnosis not present

## 2022-01-02 DIAGNOSIS — X32XXXD Exposure to sunlight, subsequent encounter: Secondary | ICD-10-CM | POA: Diagnosis not present

## 2022-01-02 DIAGNOSIS — L570 Actinic keratosis: Secondary | ICD-10-CM | POA: Diagnosis not present

## 2022-01-25 ENCOUNTER — Encounter: Payer: Self-pay | Admitting: Gastroenterology

## 2022-02-19 ENCOUNTER — Other Ambulatory Visit: Payer: Medicare HMO

## 2022-02-19 DIAGNOSIS — Z136 Encounter for screening for cardiovascular disorders: Secondary | ICD-10-CM | POA: Diagnosis not present

## 2022-02-19 DIAGNOSIS — Z125 Encounter for screening for malignant neoplasm of prostate: Secondary | ICD-10-CM | POA: Diagnosis not present

## 2022-02-19 DIAGNOSIS — E7800 Pure hypercholesterolemia, unspecified: Secondary | ICD-10-CM | POA: Diagnosis not present

## 2022-02-20 LAB — CBC WITH DIFFERENTIAL/PLATELET
Absolute Monocytes: 282 cells/uL (ref 200–950)
Basophils Absolute: 61 cells/uL (ref 0–200)
Basophils Relative: 1.3 %
Eosinophils Absolute: 122 cells/uL (ref 15–500)
Eosinophils Relative: 2.6 %
HCT: 49.1 % (ref 38.5–50.0)
Hemoglobin: 16.2 g/dL (ref 13.2–17.1)
Lymphs Abs: 1462 cells/uL (ref 850–3900)
MCH: 31.5 pg (ref 27.0–33.0)
MCHC: 33 g/dL (ref 32.0–36.0)
MCV: 95.3 fL (ref 80.0–100.0)
MPV: 10.7 fL (ref 7.5–12.5)
Monocytes Relative: 6 %
Neutro Abs: 2773 cells/uL (ref 1500–7800)
Neutrophils Relative %: 59 %
Platelets: 207 10*3/uL (ref 140–400)
RBC: 5.15 10*6/uL (ref 4.20–5.80)
RDW: 12.4 % (ref 11.0–15.0)
Total Lymphocyte: 31.1 %
WBC: 4.7 10*3/uL (ref 3.8–10.8)

## 2022-02-20 LAB — LIPID PANEL
Cholesterol: 161 mg/dL (ref <200)
HDL: 54 mg/dL (ref >=40)
LDL Cholesterol (Calc): 88 mg/dL (calc)
Non-HDL Cholesterol (Calc): 107 mg/dL (calc) (ref <130)
Total CHOL/HDL Ratio: 3 (calc) (ref <5.0)
Triglycerides: 95 mg/dL (ref <150)

## 2022-02-20 LAB — COMPLETE METABOLIC PANEL WITH GFR
AG Ratio: 2 (calc) (ref 1.0–2.5)
ALT: 18 U/L (ref 9–46)
AST: 18 U/L (ref 10–35)
Albumin: 4.7 g/dL (ref 3.6–5.1)
Alkaline phosphatase (APISO): 45 U/L (ref 35–144)
BUN: 18 mg/dL (ref 7–25)
CO2: 29 mmol/L (ref 20–32)
Calcium: 9.5 mg/dL (ref 8.6–10.3)
Chloride: 105 mmol/L (ref 98–110)
Creat: 1.17 mg/dL (ref 0.70–1.35)
Globulin: 2.3 g/dL (calc) (ref 1.9–3.7)
Glucose, Bld: 90 mg/dL (ref 65–99)
Potassium: 4.3 mmol/L (ref 3.5–5.3)
Sodium: 142 mmol/L (ref 135–146)
Total Bilirubin: 1 mg/dL (ref 0.2–1.2)
Total Protein: 7 g/dL (ref 6.1–8.1)
eGFR: 67 mL/min/{1.73_m2} (ref >=60)

## 2022-02-20 LAB — PSA: PSA: 0.61 ng/mL (ref <=4.00)

## 2022-02-20 NOTE — Progress Notes (Signed)
Annual Wellness Visit    Patient Care Team: Elby Showers, MD as PCP - General (Internal Medicine)  Visit Date: 02/22/22   Chief Complaint  Patient presents with   Medicare Wellness   Annual Exam    Subjective:   Patient: Jerry Campbell, Male    DOB: April 06, 1950, 72 y.o.   MRN: [id number]  Jerry Campbell is a 72 y.o. Male who presents today for his Annual Wellness Visit. He has a history of bronchitis, DDD, kidney stones, hyperlipidemia, hyperplastic colon polyp, transient global amnesia, confusion, constipation, chronic back pain, anxiety.  Reports feeling generally well.  History of hyperlipidemia treated with Zocor 10 mg daily at bedtime. Lipid panel on 02/19/22 normal with CHOL at 161, HDL at 54, LDL at 88, TRIG at 95.  History of chronic back pain, DDD treated with Aleve 220 mg as needed for pain. Notes accompanying positional right shoulder pain.  Labs taken 02/19/22 are normal.  Last PSA normal at 0.61 on 02/19/22.  Colonoscopy last completed 03/02/15. Results showed mild diverticulosis, small internal hemorrhoids. Two small colon polyps removed. Repeat scheduled for 03/13/22 with Dr. [place] Cellar.  He notes 2 tick bites on the right axillary, one closer to his armpit and the other closer to his waist, that he has treated with antibiotic ointment. He has not tried using Cortizone cream.   Past Medical History:  Diagnosis Date   Anxiety    takes Valium prn   Bronchitis    hx of-last time being about a yr ago   Chronic back pain    herniated disc,lumbago,spondylosis   Confusion    hx of-2008   Constipation    d/t meds;taking an OTC stool softener daily(for the past 2 wks)   DDD (degenerative disc disease)    History of kidney stones 25+yrs ago   Hyperlipidemia    takes Simvastatin daily   Hyperplastic colon polyp    Transient global amnesia      Family History  Problem Relation Age of Onset   Heart disease Mother    Hypertension Mother    Breast  cancer Mother    Prostate cancer Father    Heart disease Brother    Diabetes Paternal Grandmother    Stroke Paternal Grandmother    Colon cancer Paternal Uncle    Anesthesia problems Neg Hx    Hypotension Neg Hx    Malignant hyperthermia Neg Hx    Pseudochol deficiency Neg Hx    Esophageal cancer Neg Hx    Stomach cancer Neg Hx    Rectal cancer Neg Hx      Social History   Social History Narrative   Right handed.  Caffeine 1 cup avg daily.  Retired.  Married, 1 daughter.  Graduate school.      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  He is married.  Patient's wife formally worked as a Equities trader in Cardiology at the hospital but retired due to health issues.       Family history: Mother died of complications of dementia.  She also had glaucoma, hypertension and hypothyroidism.  1 brother with history of MI in his early 28s.  1 sister with history of disabilities passed away after having acute abdominal issue requiring surgery.         Review of Systems  Constitutional:  Negative for chills, fever, malaise/fatigue and weight loss.  HENT:  Negative for hearing loss, sinus pain and sore  throat.   Respiratory:  Negative for cough and hemoptysis.   Cardiovascular:  Negative for chest pain, palpitations, leg swelling and PND.  Gastrointestinal:  Negative for abdominal pain, constipation, diarrhea, heartburn, nausea and vomiting.  Genitourinary:  Negative for dysuria, frequency and urgency.  Musculoskeletal:  Positive for joint pain (Right shoulder). Negative for back pain, myalgias and neck pain.  Skin:  Negative for itching and rash.  Neurological:  Negative for dizziness, tingling, seizures and headaches.  Endo/Heme/Allergies:  Negative for polydipsia.  Psychiatric/Behavioral:  Negative for depression. The patient is not nervous/anxious.       Objective:   Vitals: BP 116/80   Pulse 63   Temp 98.2 F (36.8 C) (Tympanic)   Ht 5' 8"$   (1.727 m)   Wt 159 lb 12.8 oz (72.5 kg)   SpO2 99%   BMI 24.30 kg/m   Physical Exam Vitals and nursing note reviewed. Exam conducted with a chaperone present.  Constitutional:      General: He is awake. He is not in acute distress.    Appearance: Normal appearance. He is not ill-appearing or toxic-appearing.  HENT:     Head: Normocephalic and atraumatic.     Right Ear: Tympanic membrane, ear canal and external ear normal.     Left Ear: Tympanic membrane, ear canal and external ear normal.     Mouth/Throat:     Pharynx: Oropharynx is clear.  Eyes:     Extraocular Movements: Extraocular movements intact.     Pupils: Pupils are equal, round, and reactive to light.  Neck:     Thyroid: No thyroid mass, thyromegaly or thyroid tenderness.     Vascular: No carotid bruit.  Cardiovascular:     Rate and Rhythm: Normal rate and regular rhythm. No extrasystoles are present.    Pulses: Normal pulses.          Dorsalis pedis pulses are 2+ on the right side and 2+ on the left side.       Posterior tibial pulses are 2+ on the right side and 2+ on the left side.     Heart sounds: Normal heart sounds. No murmur heard.    No friction rub. No gallop.  Pulmonary:     Effort: Pulmonary effort is normal.     Breath sounds: Normal breath sounds. No decreased breath sounds, wheezing, rhonchi or rales.  Chest:     Chest wall: No mass.     Comments: Two excoriated lesions on right axillary line that are not infected or draining. Abdominal:     Palpations: Abdomen is soft. There is no hepatomegaly or splenomegaly.     Tenderness: There is no abdominal tenderness.     Hernia: No hernia is present.  Genitourinary:    Prostate: Normal. No nodules present.  Musculoskeletal:     Cervical back: Normal range of motion.     Right lower leg: No edema.     Left lower leg: No edema.  Lymphadenopathy:     Cervical: No cervical adenopathy.     Upper Body:     Right upper body: No supraclavicular adenopathy.      Left upper body: No supraclavicular adenopathy.  Skin:    General: Skin is warm and dry.  Neurological:     General: No focal deficit present.     Mental Status: He is alert and oriented to person, place, and time. Mental status is at baseline.     Cranial Nerves: Cranial nerves 2-12 are intact.  Sensory: Sensation is intact.     Motor: Motor function is intact.     Coordination: Coordination is intact.     Gait: Gait is intact.     Deep Tendon Reflexes: Reflexes are normal and symmetric.  Psychiatric:        Attention and Perception: Attention normal.        Mood and Affect: Mood normal.        Speech: Speech normal.        Behavior: Behavior normal. Behavior is cooperative.        Thought Content: Thought content normal.        Cognition and Memory: Cognition and memory normal.        Judgment: Judgment normal.      Most recent functional status assessment:    02/22/2022   10:03 AM  In your present state of health, do you have any difficulty performing the following activities:  Hearing? 0  Vision? 0  Difficulty concentrating or making decisions? 0  Walking or climbing stairs? 0  Dressing or bathing? 0  Doing errands, shopping? 0  Preparing Food and eating ? N  Using the Toilet? N  In the past six months, have you accidently leaked urine? N  Do you have problems with loss of bowel control? N  Managing your Medications? N  Managing your Finances? N  Housekeeping or managing your Housekeeping? N   Most recent fall risk assessment:    02/22/2022   10:03 AM  Fall Risk   Falls in the past year? 0  Number falls in past yr: 0  Injury with Fall? 0  Risk for fall due to : No Fall Risks  Follow up Falls prevention discussed    Most recent depression screenings:    02/22/2022   10:03 AM 02/21/2021   10:03 AM  PHQ 2/9 Scores  PHQ - 2 Score 0 0   Most recent cognitive screening:    02/22/2022   10:06 AM  6CIT Screen  What Year? 0 points  What month? 0 points   What time? 0 points  Count back from 20 0 points  Months in reverse 0 points  Repeat phrase 0 points  Total Score 0 points     Results:   Studies obtained and personally reviewed by me:  Colonoscopy last completed 03/02/15. Results showed mild diverticulosis, small internal hemorrhoids. Two small colon polyps removed. Repeat scheduled for 03/13/22 with Dr. [place] Cellar.   Labs:       Component Value Date/Time   NA 142 02/19/2022 1010   K 4.3 02/19/2022 1010   CL 105 02/19/2022 1010   CO2 29 02/19/2022 1010   GLUCOSE 90 02/19/2022 1010   BUN 18 02/19/2022 1010   CREATININE 1.17 02/19/2022 1010   CALCIUM 9.5 02/19/2022 1010   PROT 7.0 02/19/2022 1010   ALBUMIN 4.3 12/19/2015 1216   AST 18 02/19/2022 1010   ALT 18 02/19/2022 1010   ALKPHOS 40 12/19/2015 1216   BILITOT 1.0 02/19/2022 1010   GFRNONAA 62 02/15/2020 0911   GFRAA 72 02/15/2020 0911     Lab Results  Component Value Date   WBC 4.7 02/19/2022   HGB 16.2 02/19/2022   HCT 49.1 02/19/2022   MCV 95.3 02/19/2022   PLT 207 02/19/2022    Lab Results  Component Value Date   CHOL 161 02/19/2022   HDL 54 02/19/2022   LDLCALC 88 02/19/2022   TRIG 95 02/19/2022   CHOLHDL 3.0 02/19/2022  No results found for: "HGBA1C"   No results found for: "TSH"   Lab Results  Component Value Date   PSA 0.61 02/19/2022   PSA 0.66 02/20/2021   PSA 0.68 02/15/2020    Assessment & Plan:   Hyperlipidemia: Well-controlled with Zocor 10 mg daily at bedtime. Lipid panel on 02/19/22 normal with CHOL at 161, HDL at 54, LDL at 88, TRIG at 95.  Chronic back pain and DDD: Well-controlled with Aleve 220 mg as needed for pain. Notes accompanying positional right shoulder pain at times.  Colonoscopy: Last completed 03/02/15. Results showed mild diverticulosis, small internal hemorrhoids. Two small colon polyps removed. Repeat scheduled for 03/13/22 with Dr. [place] Cellar.  Tick Bites: He has two tick bites on the right  axillary area. He has been treating these with antibiotic ointment. Advised to use Cortisone cream to relieve discomfort.  Vaccine Counseling: Will get Covid-19, RSV, pneumococcal 20 vaccines at drug store. UTD on flu, pneumonia, shingles, tetanus vaccines.      Annual wellness visit done today including the all of the following: Reviewed patient's Family Medical History Reviewed and updated list of patient's medical providers Assessment of cognitive impairment was done Assessed patient's functional ability Established a written schedule for health screening [place] Completed and Reviewed  Discussed health benefits of physical activity, and encouraged him to engage in regular exercise appropriate for his age and condition.       I,Alexander Ruley,acting as a Education administrator for Elby Showers, MD.,have documented all relevant documentation on the behalf of Elby Showers, MD,as directed by  Elby Showers, MD while in the presence of Elby Showers, MD.   I, Elby Showers, MD, have reviewed all documentation for this visit. The documentation on 03/04/22 for the exam, diagnosis, procedures, and orders are all accurate and complete.

## 2022-02-22 ENCOUNTER — Ambulatory Visit (INDEPENDENT_AMBULATORY_CARE_PROVIDER_SITE_OTHER): Payer: Medicare HMO | Admitting: Internal Medicine

## 2022-02-22 ENCOUNTER — Encounter: Payer: Self-pay | Admitting: Internal Medicine

## 2022-02-22 VITALS — BP 116/80 | HR 63 | Temp 98.2°F | Ht 68.0 in | Wt 159.8 lb

## 2022-02-22 DIAGNOSIS — S20369A Insect bite (nonvenomous) of unspecified front wall of thorax, initial encounter: Secondary | ICD-10-CM | POA: Diagnosis not present

## 2022-02-22 DIAGNOSIS — Z0000 Encounter for general adult medical examination without abnormal findings: Secondary | ICD-10-CM

## 2022-02-22 DIAGNOSIS — W57XXXA Bitten or stung by nonvenomous insect and other nonvenomous arthropods, initial encounter: Secondary | ICD-10-CM

## 2022-02-22 DIAGNOSIS — E7800 Pure hypercholesterolemia, unspecified: Secondary | ICD-10-CM

## 2022-02-22 LAB — POCT URINALYSIS DIPSTICK
Bilirubin, UA: NEGATIVE
Blood, UA: NEGATIVE
Glucose, UA: NEGATIVE
Ketones, UA: NEGATIVE
Leukocytes, UA: NEGATIVE
Nitrite, UA: NEGATIVE
Protein, UA: NEGATIVE
Spec Grav, UA: <=1.005 (ref 1.010–1.025)
Urobilinogen, UA: 0.2 E.U./dL
pH, UA: 6 (ref 5.0–8.0)

## 2022-02-22 LAB — HEMOCCULT GUIAC POC 1CARD (OFFICE): Fecal Occult Blood, POC: NEGATIVE

## 2022-02-22 NOTE — Patient Instructions (Signed)
Vaccines discussed.Consider Covid booster, RSV, Pneumococcal 20.

## 2022-02-27 ENCOUNTER — Ambulatory Visit (AMBULATORY_SURGERY_CENTER): Payer: Medicare HMO | Admitting: *Deleted

## 2022-02-27 VITALS — Ht 68.0 in | Wt 158.0 lb

## 2022-02-27 DIAGNOSIS — Z800 Family history of malignant neoplasm of digestive organs: Secondary | ICD-10-CM

## 2022-02-27 DIAGNOSIS — Z86010 Personal history of colonic polyps: Secondary | ICD-10-CM

## 2022-02-27 MED ORDER — NA SULFATE-K SULFATE-MG SULF 17.5-3.13-1.6 GM/177ML PO SOLN: 1.0000 | Freq: Once | ORAL | 0 refills | Status: AC

## 2022-02-27 NOTE — Progress Notes (Signed)
No egg or soy allergy known to patient  No issues known to pt with past sedation with any surgeries or procedures Patient denies ever being told they had issues or difficulty with intubation  No FH of Malignant Hyperthermia Pt is not on diet pills Pt is not on  home 02  Pt is not on blood thinners  Pt denies issues with constipation  No A fib or A flutter Have any cardiac testing pending--no Pt instructed to use Singlecare.com or GoodRx for a price reduction on prep  Patient's chart reviewed by Jerry Campbell CNRA prior to previsit and patient appropriate for the [facility].  Previsit completed and red dot placed by patient's name on their procedure day (on provider's schedule).    

## 2022-03-01 ENCOUNTER — Encounter: Payer: Self-pay | Admitting: Gastroenterology

## 2022-03-13 ENCOUNTER — Ambulatory Visit (AMBULATORY_SURGERY_CENTER): Payer: Medicare HMO | Admitting: Gastroenterology

## 2022-03-13 ENCOUNTER — Encounter: Payer: Self-pay | Admitting: Gastroenterology

## 2022-03-13 VITALS — BP 122/73 | HR 53 | Temp 98.0°F | Resp 15 | Ht 68.0 in | Wt 158.0 lb

## 2022-03-13 DIAGNOSIS — D120 Benign neoplasm of cecum: Secondary | ICD-10-CM | POA: Diagnosis not present

## 2022-03-13 DIAGNOSIS — Z09 Encounter for follow-up examination after completed treatment for conditions other than malignant neoplasm: Secondary | ICD-10-CM | POA: Diagnosis not present

## 2022-03-13 DIAGNOSIS — D125 Benign neoplasm of sigmoid colon: Secondary | ICD-10-CM | POA: Diagnosis not present

## 2022-03-13 DIAGNOSIS — Z86010 Personal history of colonic polyps: Secondary | ICD-10-CM | POA: Diagnosis not present

## 2022-03-13 DIAGNOSIS — K635 Polyp of colon: Secondary | ICD-10-CM | POA: Diagnosis not present

## 2022-03-13 MED ORDER — SODIUM CHLORIDE 0.9 % IV SOLN: 500.0000 mL | INTRAVENOUS | Status: DC

## 2022-03-13 NOTE — Progress Notes (Signed)
No changes in health or medications since pre visit.

## 2022-03-13 NOTE — Progress Notes (Signed)
Report to PACU, RN, vss, BBS= Clear.  

## 2022-03-13 NOTE — Progress Notes (Signed)
Called to room to assist during endoscopic procedure.  Patient ID and intended procedure confirmed with present staff. Received instructions for my participation in the procedure from the performing physician.  

## 2022-03-13 NOTE — Progress Notes (Signed)
[facility] History and Physical   Primary Care Physician:  Elby Showers, MD   Reason for Procedure:   History of colon polyps  Plan:    colonoscopy     HPI: Jerry Campbell is a 72 y.o. male  here for colonoscopy surveillance - 2 adenomas removed 02/2015.  Patient denies any bowel symptoms at this time. No family history of colon cancer known. Otherwise feels well without any cardiopulmonary symptoms.   I have discussed risks / benefits of anesthesia and endoscopic procedure with Jerry Campbell and they wish to proceed with the exams as outlined today.    Past Medical History:  Diagnosis Date   Anxiety    takes Valium prn   Bronchitis    hx of-last time being about a yr ago   Cataract    "developing"   Chronic back pain    herniated disc,lumbago,spondylosis   Confusion    hx of-2008   Constipation    d/t meds;taking an OTC stool softener daily(for the past 2 wks)   DDD (degenerative disc disease)    History of kidney stones 25+yrs ago   Hyperlipidemia    takes Simvastatin daily   Hyperplastic colon polyp    Transient global amnesia     Past Surgical History:  Procedure Laterality Date   COLONOSCOPY     LUMBAR LAMINECTOMY/DECOMPRESSION MICRODISCECTOMY  04/09/2011   Procedure: LUMBAR LAMINECTOMY/DECOMPRESSION MICRODISCECTOMY;  Surgeon: Kristeen Miss, MD;  Location: [facility];  Service: Neurosurgery;  Laterality: Left;  Left Lumbar five - sacral one Microdiskectomy   UMBILICAL HERNIA REPAIR  10/10/2010   VASECTOMY  29yr ago   wisdom teeth extracted  early 80's    Prior to Admission medications   Medication Sig Start Date End Date Taking? Authorizing Provider  cholecalciferol (VITAMIN D3) 25 MCG (1000 UNIT) tablet Take 2,000 Units by mouth daily.   Yes [provider]  COCONUT OIL PO Take by mouth. 1000 mg bedtime   Yes [provider]  fish oil-omega-3 fatty acids 1000 MG capsule Take 1 g by mouth 2 (two) times daily. 1200 mg   Yes  [provider]  MELATONIN PO Take 1 tablet by mouth as needed. Reported on 02/16/2015   Yes [provider]  Multiple Vitamins-Minerals (MULTIVITAMIN WITH MINERALS) tablet Take 1 tablet by mouth daily.   Yes [provider]  simvastatin (ZOCOR) 10 MG tablet TAKE 1 TABLET BY MOUTH EVERYDAY AT BEDTIME 02/18/21  Yes Baxley, MCresenciano Lick MD  Aspirin-Salicylamide-Caffeine (BC HEADACHE POWDER PO) Take 1 packet by mouth as needed (pain/headache).    [provider]  naproxen sodium (ALEVE) 220 MG tablet Take 220 mg by mouth as needed (pain). Reported on 02/16/2015    [provider]  tretinoin (RETIN-A) 0.025 % cream Apply topically at bedtime. 01/16/22   [provider]    Current Outpatient Medications  Medication Sig Dispense Refill   cholecalciferol (VITAMIN D3) 25 MCG (1000 UNIT) tablet Take 2,000 Units by mouth daily.     COCONUT OIL PO Take by mouth. 1000 mg bedtime     fish oil-omega-3 fatty acids 1000 MG capsule Take 1 g by mouth 2 (two) times daily. 1200 mg     MELATONIN PO Take 1 tablet by mouth as needed. Reported on 02/16/2015     Multiple Vitamins-Minerals (MULTIVITAMIN WITH MINERALS) tablet Take 1 tablet by mouth daily.     simvastatin (ZOCOR) 10 MG tablet TAKE 1 TABLET BY MOUTH EVERYDAY AT BEDTIME  90 tablet 3   Aspirin-Salicylamide-Caffeine (BC HEADACHE POWDER PO) Take 1 packet by mouth as needed (pain/headache).     naproxen sodium (ALEVE) 220 MG tablet Take 220 mg by mouth as needed (pain). Reported on 02/16/2015     tretinoin (RETIN-A) 0.025 % cream Apply topically at bedtime.     Current Facility-Administered Medications  Medication Dose Route Frequency Provider Last Rate Last Admin   0.9 %  sodium chloride infusion  500 mL Intravenous Continuous Zehava Turski, Carlota Raspberry, MD        Allergies as of 03/13/2022   (No Known Allergies)    Family History  Problem Relation Age of Onset   Heart disease Mother    Hypertension Mother     Breast cancer Mother    Prostate cancer Father    Heart disease Brother    Colon cancer Paternal Uncle    Diabetes Paternal Grandmother    Stroke Paternal Grandmother    Anesthesia problems Neg Hx    Hypotension Neg Hx    Malignant hyperthermia Neg Hx    Pseudochol deficiency Neg Hx    Esophageal cancer Neg Hx    Stomach cancer Neg Hx    Rectal cancer Neg Hx    Colon polyps Neg Hx    Crohn's disease Neg Hx    Ulcerative colitis Neg Hx     Social History   Socioeconomic History   Marital status: Married    Spouse name: Not on file   Number of children: Not on file   Years of education: Not on file   Highest education level: Not on file  Occupational History   Not on file  Tobacco Use   Smoking status: Never   Smokeless tobacco: Never  Vaping Use   Vaping Use: Never used  Substance and Sexual Activity   Alcohol use: Yes    Alcohol/week: 2.0 standard drinks of alcohol    Types: 2 Cans of beer per week    Comment: 1-2 per week   Drug use: No   Sexual activity: Yes  Other Topics Concern   Not on file  Social History Narrative   Right handed.  Caffeine 1 cup avg daily.  Retired.  Married, 1 daughter.  Graduate school.      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  He is married.  Patient's wife formally worked as a Equities trader in Cardiology at the hospital but retired due to health issues.       Family history: Mother died of complications of dementia.  She also had glaucoma, hypertension and hypothyroidism.  1 brother with history of MI in his early 10s.  1 sister with history of disabilities passed away after having acute abdominal issue requiring surgery.       Social Determinants of Health   Financial Resource Strain: Not on file  Food Insecurity: Not on file  Transportation Needs: Not on file  Physical Activity: Not on file  Stress: Not on file  Social Connections: Not on file  Intimate Partner Violence: Not on file     Review of Systems: All other review of systems negative except as mentioned in the HPI.  Physical Exam: Vital signs BP (!) 140/82   Pulse (!) 55   Temp 98 F (36.7 C)   Ht 5' 8"$  (1.727 m)   Wt 158 lb (71.7 kg)   SpO2 98%   BMI 24.02 kg/m   General:   Alert,  Well-developed,  pleasant and cooperative in NAD Lungs:  Clear throughout to auscultation.   Heart:  Regular rate and rhythm Abdomen:  Soft, nontender and nondistended.   Neuro/Psych:  Alert and cooperative. Normal mood and affect. A and O x 3  Jolly Mango, MD [facility]

## 2022-03-13 NOTE — Patient Instructions (Signed)
Discharge instructions given. Handouts on polyps and Hemorrhoids. Resume previous medications. YOU HAD AN ENDOSCOPIC PROCEDURE TODAY AT [facility]:   Refer to the procedure report that was given to you for any specific questions about what was found during the examination.  If the procedure report does not answer your questions, please call your gastroenterologist to clarify.  If you requested that your care partner not be given the details of your procedure findings, then the procedure report has been included in a sealed envelope for you to review at your convenience later.  YOU SHOULD EXPECT: Some feelings of bloating in the abdomen. Passage of more gas than usual.  Walking can help get rid of the air that was put into your GI tract during the procedure and reduce the bloating. If you had a lower endoscopy (such as a colonoscopy or flexible sigmoidoscopy) you may notice spotting of blood in your stool or on the toilet paper. If you underwent a bowel prep for your procedure, you may not have a normal bowel movement for a few days.  Please Note:  You might notice some irritation and congestion in your nose or some drainage.  This is from the oxygen used during your procedure.  There is no need for concern and it should clear up in a day or so.  SYMPTOMS TO REPORT IMMEDIATELY:  Following lower endoscopy (colonoscopy or flexible sigmoidoscopy):  Excessive amounts of blood in the stool  Significant tenderness or worsening of abdominal pains  Swelling of the abdomen that is new, acute  Fever of 100F or higher   For urgent or emergent issues, a gastroenterologist can be reached at any hour by calling [phone]. Do not use MyChart messaging for urgent concerns.    DIET:  We do recommend a small meal at first, but then you may proceed to your regular diet.  Drink plenty of fluids but you should avoid alcoholic beverages for 24 hours.  ACTIVITY:  You should plan to take it  easy for the rest of today and you should NOT DRIVE or use heavy machinery until tomorrow (because of the sedation medicines used during the test).    FOLLOW UP: Our staff will call the number listed on your records the next business day following your procedure.  We will call around 7:15- 8:00 am to check on you and address any questions or concerns that you may have regarding the information given to you following your procedure. If we do not reach you, we will leave a message.     If any biopsies were taken you will be contacted by phone or by letter within the next 1-3 weeks.  Please call us at [phone] if you have not heard about the biopsies in 3 weeks.    SIGNATURES/CONFIDENTIALITY: You and/or your care partner have signed paperwork which will be entered into your electronic medical record.  These signatures attest to the fact that that the information above on your After Visit Summary has been reviewed and is understood.  Full responsibility of the confidentiality of this discharge information lies with you and/or your care-partner.

## 2022-03-13 NOTE — Op Note (Signed)
[facility] Patient Name: Jerry Campbell Procedure Date: 03/13/2022 11:15 AM MRN: [id number] Endoscopist: Remo Lipps P. Havery Moros , MD, [id number] Age: 72 Referring MD:  Date of Birth: 1950-11-05 Gender: Male Account #: [id number] Procedure:                Colonoscopy Indications:              High risk colon cancer surveillance: Personal                            history of colonic polyps - 2 small adenomas                            removed 02/2015 Medicines:                Monitored Anesthesia Care Procedure:                Pre-Anesthesia Assessment:                           - Prior to the procedure, a History and Physical                            was performed, and patient medications and                            allergies were reviewed. The patient's tolerance of                            previous anesthesia was also reviewed. The risks                            and benefits of the procedure and the sedation                            options and risks were discussed with the patient.                            All questions were answered, and informed consent                            was obtained. Prior Anticoagulants: The patient has                            taken no anticoagulant or antiplatelet agents. ASA                            Grade Assessment: II - A patient with mild systemic                            disease. After reviewing the risks and benefits,                            the patient was deemed in satisfactory condition to  undergo the procedure.                           After obtaining informed consent, the colonoscope                            was passed under direct vision. Throughout the                            procedure, the patient's blood pressure, pulse, and                            oxygen saturations were monitored continuously. The                            Olympus [id number]-[id number]) Colonoscope was                             introduced through the anus and advanced to the the                            cecum, identified by appendiceal orifice and                            ileocecal valve. The colonoscopy was performed                            without difficulty. The patient tolerated the                            procedure well. The quality of the bowel                            preparation was good. The ileocecal valve,                            appendiceal orifice, and rectum were photographed. Scope In: 11:23:43 AM Scope Out: 11:37:08 AM Scope Withdrawal Time: 0 hours 10 minutes 43 seconds  Total Procedure Duration: 0 hours 13 minutes 25 seconds  Findings:                 The perianal and digital rectal examinations were                            normal.                           A diminutive polyp was found in the cecum. The                            polyp was sessile. The polyp was removed with a                            cold snare. Resection and retrieval were complete.  Two sessile polyps were found in the sigmoid colon.                            The polyps were 3 mm in size. These polyps were                            removed with a cold snare. Resection and retrieval                            were complete.                           Anal papilla(e) was hypertrophied.                           Internal hemorrhoids were found during retroflexion.                           The exam was otherwise without abnormality. Complications:            No immediate complications. Estimated blood loss:                            Minimal. Estimated Blood Loss:     Estimated blood loss was minimal. Impression:               - One diminutive polyp in the cecum, removed with a                            cold snare. Resected and retrieved.                           - Two 3 mm polyps in the sigmoid colon, removed                            with a cold  snare. Resected and retrieved.                           - Anal papilla(e) was hypertrophied.                           - Internal hemorrhoids.                           - The examination was otherwise normal. Recommendation:           - Patient has a contact number available for                            emergencies. The signs and symptoms of potential                            delayed complications were discussed with the                            patient. Return to normal activities  tomorrow.                            Written discharge instructions were provided to the                            patient.                           - Resume previous diet.                           - Continue present medications.                           - Await pathology results. Remo Lipps P. Dai Mcadams, MD 03/13/2022 11:41:01 AM This report has been signed electronically.

## 2022-03-14 ENCOUNTER — Telehealth: Payer: Self-pay | Admitting: *Deleted

## 2022-03-14 NOTE — Telephone Encounter (Signed)
  Follow up Call-     03/13/2022   10:39 AM  Call back number  Post procedure Call Back phone  # [phone]  Permission to leave phone message Yes     Patient questions:  Do you have a fever, pain , or abdominal swelling? No. Pain Score  0 *  Have you tolerated food without any problems? Yes.    Have you been able to return to your normal activities? Yes.    Do you have any questions about your discharge instructions: Diet   No. Medications  No. Follow up visit  No.  Do you have questions or concerns about your Care? No.  Actions: * If pain score is 4 or above: No action needed, pain <4.

## 2022-04-01 ENCOUNTER — Other Ambulatory Visit: Payer: Self-pay | Admitting: Internal Medicine

## 2022-07-05 DIAGNOSIS — Z135 Encounter for screening for eye and ear disorders: Secondary | ICD-10-CM | POA: Diagnosis not present

## 2022-07-05 DIAGNOSIS — H2513 Age-related nuclear cataract, bilateral: Secondary | ICD-10-CM | POA: Diagnosis not present

## 2022-07-05 DIAGNOSIS — H524 Presbyopia: Secondary | ICD-10-CM | POA: Diagnosis not present

## 2022-07-05 DIAGNOSIS — H52223 Regular astigmatism, bilateral: Secondary | ICD-10-CM | POA: Diagnosis not present

## 2022-07-05 DIAGNOSIS — D3131 Benign neoplasm of right choroid: Secondary | ICD-10-CM | POA: Diagnosis not present

## 2022-07-05 DIAGNOSIS — H5213 Myopia, bilateral: Secondary | ICD-10-CM | POA: Diagnosis not present

## 2022-07-06 DIAGNOSIS — D225 Melanocytic nevi of trunk: Secondary | ICD-10-CM | POA: Diagnosis not present

## 2022-07-06 DIAGNOSIS — C4441 Basal cell carcinoma of skin of scalp and neck: Secondary | ICD-10-CM | POA: Diagnosis not present

## 2022-07-06 DIAGNOSIS — C441122 Basal cell carcinoma of skin of right lower eyelid, including canthus: Secondary | ICD-10-CM | POA: Diagnosis not present

## 2022-07-06 DIAGNOSIS — Z1283 Encounter for screening for malignant neoplasm of skin: Secondary | ICD-10-CM | POA: Diagnosis not present

## 2022-07-30 DIAGNOSIS — C441122 Basal cell carcinoma of skin of right lower eyelid, including canthus: Secondary | ICD-10-CM | POA: Diagnosis not present

## 2022-08-15 DIAGNOSIS — L570 Actinic keratosis: Secondary | ICD-10-CM | POA: Diagnosis not present

## 2022-08-15 DIAGNOSIS — Z85828 Personal history of other malignant neoplasm of skin: Secondary | ICD-10-CM | POA: Diagnosis not present

## 2022-08-15 DIAGNOSIS — Z08 Encounter for follow-up examination after completed treatment for malignant neoplasm: Secondary | ICD-10-CM | POA: Diagnosis not present

## 2022-08-15 DIAGNOSIS — X32XXXD Exposure to sunlight, subsequent encounter: Secondary | ICD-10-CM | POA: Diagnosis not present

## 2022-10-08 DIAGNOSIS — H02132 Senile ectropion of right lower eyelid: Secondary | ICD-10-CM | POA: Diagnosis not present

## 2022-10-08 DIAGNOSIS — C441122 Basal cell carcinoma of skin of right lower eyelid, including canthus: Secondary | ICD-10-CM | POA: Diagnosis not present

## 2022-10-08 DIAGNOSIS — Z01818 Encounter for other preprocedural examination: Secondary | ICD-10-CM | POA: Diagnosis not present

## 2022-10-30 DIAGNOSIS — C441122 Basal cell carcinoma of skin of right lower eyelid, including canthus: Secondary | ICD-10-CM | POA: Diagnosis not present

## 2022-10-31 DIAGNOSIS — H02132 Senile ectropion of right lower eyelid: Secondary | ICD-10-CM | POA: Diagnosis not present

## 2022-10-31 DIAGNOSIS — S01111A Laceration without foreign body of right eyelid and periocular area, initial encounter: Secondary | ICD-10-CM | POA: Diagnosis not present

## 2022-10-31 DIAGNOSIS — H0289 Other specified disorders of eyelid: Secondary | ICD-10-CM | POA: Diagnosis not present

## 2022-10-31 DIAGNOSIS — L988 Other specified disorders of the skin and subcutaneous tissue: Secondary | ICD-10-CM | POA: Diagnosis not present

## 2022-10-31 DIAGNOSIS — C441122 Basal cell carcinoma of skin of right lower eyelid, including canthus: Secondary | ICD-10-CM | POA: Diagnosis not present

## 2022-10-31 DIAGNOSIS — Z85828 Personal history of other malignant neoplasm of skin: Secondary | ICD-10-CM | POA: Diagnosis not present

## 2022-10-31 DIAGNOSIS — Z481 Encounter for planned postprocedural wound closure: Secondary | ICD-10-CM | POA: Diagnosis not present

## 2022-10-31 DIAGNOSIS — Z01818 Encounter for other preprocedural examination: Secondary | ICD-10-CM | POA: Diagnosis not present

## 2022-10-31 DIAGNOSIS — Z483 Aftercare following surgery for neoplasm: Secondary | ICD-10-CM | POA: Diagnosis not present

## 2023-02-20 NOTE — Progress Notes (Signed)
 Annual Medicare Wellness Visit   Patient Care Team: Perri Ronal PARAS, MD as PCP - General (Internal Medicine)  Visit Date: 02/26/23   Chief Complaint  Patient presents with   Annual Exam   Medicare Wellness   Subjective:  Patient: Jerry Campbell, Male DOB: 1950/01/31, 73 y.o. MRN: [id number]  Jerry Campbell is a 73 y.o. Male who presents today for his Annual Medicare Wellness Visit. Patient has history of bronchitis, DDD, kidney stones, hyperlipidemia, hyperplastic colon polyp, transient global amnesia, confusion, constipation, chronic back pain, anxiety.   Has a slight red area on his face, which he reports is likely due to the fact that he rolls around in his sleep.   History of Hyperlipidemia treated with Simvastatin  10 mg daily at bedtime. 02/25/23 Lipid Panel: WNL.    History of Chronic Back Pain; DDD treated with Aleve 220 mg as needed for pain. Has noted accompanying positional right shoulder pain.  Labs 02/25/23 CBC: WNL CMP: WNL  Colonoscopy 03/13/22 performed by Elspeth Naval, MD with 3 polyps removed - 1 sessile diminutive in the cecum and two sessile 3 mm in sigmoid (at least 1 of theses polyps was found to be adenomatous); Anal papilla(e) hypertrophied; Internal hemorrhoids found on retroflexion with repeat recommendation of 01/05/2026.  PSA 0.60 on 02/25/23  Vaccine Counseling: UTD on Tdap, PNA, Shingles, & Flu & Covid-19.  Past Medical History:  Diagnosis Date   Anxiety    takes Valium  prn   Bronchitis    hx of-last time being about a yr ago   Cataract    developing   Chronic back pain    herniated disc,lumbago,spondylosis   Confusion    hx of-2008   Constipation    d/t meds;taking an OTC stool softener daily(for the past 2 wks)   DDD (degenerative disc disease)    History of kidney stones 25+yrs ago   Hyperlipidemia    takes Simvastatin  daily   Hyperplastic colon polyp    Transient global amnesia   Medical/Surgical History Narrative:  February 2018 he was  on a ladder and fell some 10 to 12 feet tearing his ACL and strained his MCL. Surgery was not required. Occasionally knee will give away with him.  He has had 4 episodes of transient global amnesia evaluated by cardiology and neurology. The last episode occurred in 01/05/2014 when his mother passed away. He was hospitalized briefly at that time and had negative MRI of the brain. He has had extensive evaluation for this.  History of left shoulder pain status post shoulder fracture in Jan 06, 2012.  Had MRI of the neck in 01-06-12 showing cervical disc disease with mild impingement and multilevel involvement.  History of lumbar back surgery for herniated disc L5-S1 by Dr. Colon.  Vasectomy in 01-05-90.  History of hyperplastic colon polyps.  No known drug allergies.  Thinks he perhaps suffered a concussion from a fall from a ladder in Jan 05, 1994.History of fractured left wrist secondary to a fall in January 05, 1994.  Fractured left fifth toe in the 1980s. History of fractured right toe in karate in the early 1990s. Fractured left shoulder with possible rotator cuff tear 01/06/04.  Family History  Problem Relation Age of Onset   Heart disease Mother    Hypertension Mother    Breast cancer Mother    Prostate cancer Father    Heart disease Brother    Colon cancer Paternal Uncle    Diabetes Paternal Grandmother    Stroke Paternal Grandmother  Anesthesia problems Neg Hx    Hypotension Neg Hx    Malignant hyperthermia Neg Hx    Pseudochol deficiency Neg Hx    Esophageal cancer Neg Hx    Stomach cancer Neg Hx    Rectal cancer Neg Hx    Colon polyps Neg Hx    Crohn's disease Neg Hx    Ulcerative colitis Neg Hx    Social History   Social History Narrative   Right handed.  Caffeine 1 cup avg daily.  Retired.  Married, 1 daughter.  Graduate school.      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  He is married.  Patient's wife formally worked as a Designer, Jewellery in Cardiology at  the hospital but retired due to health issues.       Family history: Mother died of complications of dementia.  She also had glaucoma, hypertension and hypothyroidism.  1 brother with history of MI in his early 21s.  1 sister with history of disabilities passed away after having acute abdominal issue requiring surgery.      Planning a cruise for this year. Review of Systems  Constitutional:  Negative for chills, fever, malaise/fatigue and weight loss.  HENT:  Negative for hearing loss, sinus pain and sore throat.   Respiratory:  Negative for cough, hemoptysis and shortness of breath.   Cardiovascular:  Negative for chest pain, palpitations, leg swelling and PND.  Gastrointestinal:  Negative for abdominal pain, constipation, diarrhea, heartburn, nausea and vomiting.  Genitourinary:  Negative for dysuria, frequency and urgency.  Musculoskeletal:  Negative for back pain, myalgias and neck pain.  Skin:  Negative for itching and rash.  Neurological:  Negative for dizziness, tingling, seizures and headaches.  Endo/Heme/Allergies:  Negative for polydipsia.  Psychiatric/Behavioral:  Negative for depression. The patient is not nervous/anxious.     Objective:  Vitals: BP 120/80   Pulse 61   Ht 5' 8 (1.727 m)   Wt 161 lb (73 kg)   SpO2 97%   BMI 24.48 kg/m  Physical Exam Vitals and nursing note reviewed. Exam conducted with a chaperone present Zita Bern, CMA).  Constitutional:      General: He is awake. He is not in acute distress.    Appearance: Normal appearance. He is not ill-appearing or toxic-appearing.  HENT:     Head: Normocephalic and atraumatic.     Right Ear: Tympanic membrane, ear canal and external ear normal.     Left Ear: Tympanic membrane, ear canal and external ear normal.     Mouth/Throat:     Pharynx: Oropharynx is clear.  Eyes:     Extraocular Movements: Extraocular movements intact.     Pupils: Pupils are equal, round, and reactive to light.  Neck:      Thyroid: No thyroid mass, thyromegaly or thyroid tenderness.     Vascular: No carotid bruit.  Cardiovascular:     Rate and Rhythm: Normal rate and regular rhythm. No extrasystoles are present.    Pulses:          Dorsalis pedis pulses are 1+ on the right side and 1+ on the left side.       Posterior tibial pulses are 1+ on the right side and 1+ on the left side.     Heart sounds: Normal heart sounds. No murmur heard.    No friction rub. No gallop.  Pulmonary:     Effort: Pulmonary effort is normal.     Breath  sounds: Normal breath sounds. No decreased breath sounds, wheezing, rhonchi or rales.  Chest:     Chest wall: No mass.  Abdominal:     Palpations: Abdomen is soft. There is no hepatomegaly, splenomegaly or mass.     Tenderness: There is no abdominal tenderness.     Hernia: No hernia is present.  Genitourinary:    Testes: Normal.        Right: Mass not present.        Left: Mass not present.     Epididymis:     Right: Normal.     Left: Normal.     Prostate: Normal. Not enlarged, not tender and no nodules present.     Rectum: Normal. No mass.  Musculoskeletal:     Cervical back: Normal range of motion.     Right lower leg: No edema.     Left lower leg: No edema.  Lymphadenopathy:     Cervical: No cervical adenopathy.     Upper Body:     Right upper body: No supraclavicular adenopathy.     Left upper body: No supraclavicular adenopathy.     Lower Body: No right inguinal adenopathy. No left inguinal adenopathy.  Skin:    General: Skin is warm and dry.  Neurological:     General: No focal deficit present.     Mental Status: He is alert and oriented to person, place, and time. Mental status is at baseline.     Cranial Nerves: Cranial nerves 2-12 are intact.     Sensory: Sensation is intact.     Motor: Motor function is intact.     Coordination: Coordination is intact.     Gait: Gait is intact.     Deep Tendon Reflexes: Reflexes are normal and symmetric.  Psychiatric:         Attention and Perception: Attention normal.        Mood and Affect: Mood normal.        Speech: Speech normal.        Behavior: Behavior normal. Behavior is cooperative.        Thought Content: Thought content normal.        Cognition and Memory: Cognition and memory normal.        Judgment: Judgment normal.   Most Recent Functional Status Assessment:    02/26/2023    9:48 AM  In your present state of health, do you have any difficulty performing the following activities:  Hearing? 0  Vision? 0  Difficulty concentrating or making decisions? 0  Walking or climbing stairs? 0  Dressing or bathing? 0  Doing errands, shopping? 0  Preparing Food and eating ? N  Using the Toilet? N  In the past six months, have you accidently leaked urine? N  Do you have problems with loss of bowel control? N  Managing your Medications? N  Managing your Finances? N  Housekeeping or managing your Housekeeping? N   Most Recent Fall Risk Assessment:    02/26/2023    9:50 AM  Fall Risk   Falls in the past year? 0  Number falls in past yr: 0  Injury with Fall? 0  Risk for fall due to : No Fall Risks  Follow up Falls prevention discussed;Education provided;Falls evaluation completed   Most Recent Depression Screenings:    02/26/2023   10:02 AM 02/22/2022   10:03 AM  PHQ 2/9 Scores  PHQ - 2 Score 0 0   Most Recent Cognitive Screening:  02/22/2022   10:06 AM  6CIT Screen  What Year? 0 points  What month? 0 points  What time? 0 points  Count back from 20 0 points  Months in reverse 0 points  Repeat phrase 0 points  Total Score 0 points   Results:  Studies obtained and personally reviewed by me:  Colonoscopy 03/13/22 performed by Elspeth Naval, MD with 3 polyps removed - 1 sessile diminutive in the cecum and two sessile 3 mm in sigmoid (at least 1 of theses polyps was found to be adenomatous); Anal papilla(e) hypertrophied; Internal hemorrhoids found on retroflexion with repeat  recommendation of 2027.  Labs:     Component Value Date/Time   NA 144 02/25/2023 1138   K 4.6 02/25/2023 1138   CL 109 02/25/2023 1138   CO2 29 02/25/2023 1138   GLUCOSE 91 02/25/2023 1138   BUN 23 02/25/2023 1138   CREATININE 1.17 02/25/2023 1138   CALCIUM 9.3 02/25/2023 1138   PROT 6.4 02/25/2023 1138   ALBUMIN 4.3 12/19/2015 1216   AST 19 02/25/2023 1138   ALT 18 02/25/2023 1138   ALKPHOS 40 12/19/2015 1216   BILITOT 0.6 02/25/2023 1138   GFRNONAA 62 02/15/2020 0911   GFRAA 72 02/15/2020 0911    Lab Results  Component Value Date   WBC 4.2 02/25/2023   HGB 15.5 02/25/2023   HCT 47.1 02/25/2023   MCV 96.3 02/25/2023   PLT 196 02/25/2023   Lab Results  Component Value Date   CHOL 142 02/25/2023   HDL 52 02/25/2023   LDLCALC 77 02/25/2023   TRIG 53 02/25/2023   CHOLHDL 2.7 02/25/2023   Lab Results  Component Value Date   PSA 0.60 02/25/2023   PSA 0.61 02/19/2022   PSA 0.66 02/20/2021     Assessment & Plan:  Other Labs Reviewed today: CBC: WNL CMP: WNL  Hyperlipidemia treated with Simvastatin  10 mg daily at bedtime. 02/25/23 Lipid Panel: WNL.    Chronic Back Pain; DDD treated with Aleve 220 mg as needed for pain. Has noted accompanying positional right shoulder pain.  Colonoscopy 03/13/22 performed by Elspeth Naval, MD with 3 polyps removed - 1 sessile diminutive in the cecum and two sessile 3 mm in sigmoid (at least 1 of theses polyps was found to be adenomatous); Anal papilla(e) hypertrophied; Internal hemorrhoids found on retroflexion with repeat recommendation of 2027.  PSA 0.60 on 02/25/23  Vaccine Counseling: UTD on Tdap, PNA, Flu, Covid-19, & Shingles  Return in 1 year for labs (02/24/24, 9:10 AM) and annual visit (02/27/2024, 10 AM)    Annual wellness visit done today including the all of the following: Reviewed patient's Family Medical History Reviewed and updated list of patient's medical providers Assessment of cognitive impairment was  done Assessed patient's functional ability Established a written schedule for health screening services Health Risk Assessent Completed and Reviewed  Discussed health benefits of physical activity, and encouraged him to engage in regular exercise appropriate for his age and condition.    I,Emily Lagle,acting as a neurosurgeon for Ronal JINNY Hailstone, MD.,have documented all relevant documentation on the behalf of Ronal JINNY Hailstone, MD,as directed by  Ronal JINNY Hailstone, MD while in the presence of Ronal JINNY Hailstone, MD.   I, Ronal JINNY Hailstone, MD, have reviewed all documentation for this visit. The documentation on 03/10/23 for the exam, diagnosis, procedures, and orders are all accurate and complete.

## 2023-02-25 ENCOUNTER — Other Ambulatory Visit: Payer: Medicare HMO

## 2023-02-25 DIAGNOSIS — Z0000 Encounter for general adult medical examination without abnormal findings: Secondary | ICD-10-CM

## 2023-02-25 DIAGNOSIS — E7800 Pure hypercholesterolemia, unspecified: Secondary | ICD-10-CM

## 2023-02-26 ENCOUNTER — Ambulatory Visit: Payer: Medicare HMO | Admitting: Internal Medicine

## 2023-02-26 ENCOUNTER — Encounter: Payer: Self-pay | Admitting: Internal Medicine

## 2023-02-26 VITALS — BP 120/80 | HR 61 | Ht 68.0 in | Wt 161.0 lb

## 2023-02-26 DIAGNOSIS — E7800 Pure hypercholesterolemia, unspecified: Secondary | ICD-10-CM

## 2023-02-26 DIAGNOSIS — Z0000 Encounter for general adult medical examination without abnormal findings: Secondary | ICD-10-CM | POA: Diagnosis not present

## 2023-02-26 LAB — POCT URINALYSIS DIP (CLINITEK)
Bilirubin, UA: NEGATIVE
Blood, UA: NEGATIVE
Glucose, UA: NEGATIVE mg/dL
Ketones, POC UA: NEGATIVE mg/dL
Leukocytes, UA: NEGATIVE
Nitrite, UA: NEGATIVE
POC PROTEIN,UA: NEGATIVE
Spec Grav, UA: 1.015 (ref 1.010–1.025)
Urobilinogen, UA: 0.2 U/dL
pH, UA: 6 (ref 5.0–8.0)

## 2023-02-26 LAB — COMPLETE METABOLIC PANEL WITH GFR
AG Ratio: 2 (calc) (ref 1.0–2.5)
ALT: 18 U/L (ref 9–46)
AST: 19 U/L (ref 10–35)
Albumin: 4.3 g/dL (ref 3.6–5.1)
Alkaline phosphatase (APISO): 46 U/L (ref 35–144)
BUN: 23 mg/dL (ref 7–25)
CO2: 29 mmol/L (ref 20–32)
Calcium: 9.3 mg/dL (ref 8.6–10.3)
Chloride: 109 mmol/L (ref 98–110)
Creat: 1.17 mg/dL (ref 0.70–1.28)
Globulin: 2.1 g/dL (ref 1.9–3.7)
Glucose, Bld: 91 mg/dL (ref 65–99)
Potassium: 4.6 mmol/L (ref 3.5–5.3)
Sodium: 144 mmol/L (ref 135–146)
Total Bilirubin: 0.6 mg/dL (ref 0.2–1.2)
Total Protein: 6.4 g/dL (ref 6.1–8.1)
eGFR: 67 mL/min/{1.73_m2} (ref >=60)

## 2023-02-26 LAB — CBC WITH DIFFERENTIAL/PLATELET
Absolute Lymphocytes: 1596 {cells}/uL (ref 850–3900)
Absolute Monocytes: 265 {cells}/uL (ref 200–950)
Basophils Absolute: 59 {cells}/uL (ref 0–200)
Basophils Relative: 1.4 %
Eosinophils Absolute: 130 {cells}/uL (ref 15–500)
Eosinophils Relative: 3.1 %
HCT: 47.1 % (ref 38.5–50.0)
Hemoglobin: 15.5 g/dL (ref 13.2–17.1)
MCH: 31.7 pg (ref 27.0–33.0)
MCHC: 32.9 g/dL (ref 32.0–36.0)
MCV: 96.3 fL (ref 80.0–100.0)
MPV: 10.9 fL (ref 7.5–12.5)
Monocytes Relative: 6.3 %
Neutro Abs: 2150 {cells}/uL (ref 1500–7800)
Neutrophils Relative %: 51.2 %
Platelets: 196 10*3/uL (ref 140–400)
RBC: 4.89 10*6/uL (ref 4.20–5.80)
RDW: 12.3 % (ref 11.0–15.0)
Total Lymphocyte: 38 %
WBC: 4.2 10*3/uL (ref 3.8–10.8)

## 2023-02-26 LAB — LIPID PANEL
Cholesterol: 142 mg/dL (ref <200)
HDL: 52 mg/dL (ref >=40)
LDL Cholesterol (Calc): 77 mg/dL
Non-HDL Cholesterol (Calc): 90 mg/dL (ref <130)
Total CHOL/HDL Ratio: 2.7 (calc) (ref <5.0)
Triglycerides: 53 mg/dL (ref <150)

## 2023-02-26 LAB — PSA: PSA: 0.6 ng/mL (ref <=4.00)

## 2023-03-10 ENCOUNTER — Encounter: Payer: Self-pay | Admitting: Internal Medicine

## 2023-03-10 NOTE — Patient Instructions (Signed)
 It is a pleasure to see you today. Labs are stable. Please continue statin medication and return in one year or as needed.

## 2023-03-30 ENCOUNTER — Other Ambulatory Visit: Payer: Self-pay | Admitting: Internal Medicine

## 2023-04-10 DIAGNOSIS — H5202 Hypermetropia, left eye: Secondary | ICD-10-CM | POA: Diagnosis not present

## 2023-04-10 DIAGNOSIS — H52223 Regular astigmatism, bilateral: Secondary | ICD-10-CM | POA: Diagnosis not present

## 2023-04-10 DIAGNOSIS — H25813 Combined forms of age-related cataract, bilateral: Secondary | ICD-10-CM | POA: Diagnosis not present

## 2023-04-10 DIAGNOSIS — H524 Presbyopia: Secondary | ICD-10-CM | POA: Diagnosis not present

## 2023-05-23 DIAGNOSIS — Z0100 Encounter for examination of eyes and vision without abnormal findings: Secondary | ICD-10-CM | POA: Diagnosis not present

## 2023-06-13 DIAGNOSIS — H02532 Eyelid retraction right lower eyelid: Secondary | ICD-10-CM | POA: Diagnosis not present

## 2023-06-13 DIAGNOSIS — D485 Neoplasm of uncertain behavior of skin: Secondary | ICD-10-CM | POA: Diagnosis not present

## 2023-06-13 DIAGNOSIS — H0279 Other degenerative disorders of eyelid and periocular area: Secondary | ICD-10-CM | POA: Diagnosis not present

## 2023-06-13 DIAGNOSIS — C441222 Squamous cell carcinoma of skin of right lower eyelid, including canthus: Secondary | ICD-10-CM | POA: Diagnosis not present

## 2023-07-09 DIAGNOSIS — L821 Other seborrheic keratosis: Secondary | ICD-10-CM | POA: Diagnosis not present

## 2023-07-09 DIAGNOSIS — D225 Melanocytic nevi of trunk: Secondary | ICD-10-CM | POA: Diagnosis not present

## 2023-07-09 DIAGNOSIS — Z1283 Encounter for screening for malignant neoplasm of skin: Secondary | ICD-10-CM | POA: Diagnosis not present

## 2023-07-09 DIAGNOSIS — X32XXXD Exposure to sunlight, subsequent encounter: Secondary | ICD-10-CM | POA: Diagnosis not present

## 2023-07-09 DIAGNOSIS — L570 Actinic keratosis: Secondary | ICD-10-CM | POA: Diagnosis not present

## 2023-08-19 ENCOUNTER — Ambulatory Visit (INDEPENDENT_AMBULATORY_CARE_PROVIDER_SITE_OTHER): Admitting: Internal Medicine

## 2023-08-19 ENCOUNTER — Ambulatory Visit: Payer: Self-pay

## 2023-08-19 ENCOUNTER — Other Ambulatory Visit: Payer: Self-pay

## 2023-08-19 ENCOUNTER — Emergency Department (HOSPITAL_BASED_OUTPATIENT_CLINIC_OR_DEPARTMENT_OTHER)

## 2023-08-19 ENCOUNTER — Encounter (HOSPITAL_COMMUNITY): Payer: Self-pay | Admitting: Internal Medicine

## 2023-08-19 ENCOUNTER — Observation Stay (HOSPITAL_BASED_OUTPATIENT_CLINIC_OR_DEPARTMENT_OTHER)
Admission: EM | Admit: 2023-08-19 | Discharge: 2023-08-20 | Disposition: A | Discharge status: Home / Self Care | Attending: Family Medicine | Admitting: Family Medicine

## 2023-08-19 ENCOUNTER — Encounter: Payer: Self-pay | Admitting: Internal Medicine

## 2023-08-19 VITALS — BP 140/100 | HR 66 | Temp 97.9°F | Ht 68.0 in | Wt 154.0 lb

## 2023-08-19 DIAGNOSIS — K4090 Unilateral inguinal hernia, without obstruction or gangrene, not specified as recurrent: Secondary | ICD-10-CM | POA: Diagnosis not present

## 2023-08-19 DIAGNOSIS — N179 Acute kidney failure, unspecified: Principal | ICD-10-CM | POA: Insufficient documentation

## 2023-08-19 DIAGNOSIS — N132 Hydronephrosis with renal and ureteral calculous obstruction: Secondary | ICD-10-CM | POA: Diagnosis not present

## 2023-08-19 DIAGNOSIS — N200 Calculus of kidney: Secondary | ICD-10-CM

## 2023-08-19 DIAGNOSIS — R1032 Left lower quadrant pain: Secondary | ICD-10-CM | POA: Diagnosis not present

## 2023-08-19 DIAGNOSIS — E785 Hyperlipidemia, unspecified: Secondary | ICD-10-CM | POA: Insufficient documentation

## 2023-08-19 DIAGNOSIS — I10 Essential (primary) hypertension: Secondary | ICD-10-CM | POA: Diagnosis not present

## 2023-08-19 DIAGNOSIS — N400 Enlarged prostate without lower urinary tract symptoms: Secondary | ICD-10-CM | POA: Diagnosis not present

## 2023-08-19 DIAGNOSIS — R1030 Lower abdominal pain, unspecified: Secondary | ICD-10-CM | POA: Diagnosis not present

## 2023-08-19 LAB — COMPREHENSIVE METABOLIC PANEL WITH GFR
ALT: 18 U/L (ref 0–44)
AST: 27 U/L (ref 15–41)
Albumin: 4.3 g/dL (ref 3.5–5.0)
Alkaline Phosphatase: 57 U/L (ref 38–126)
Anion gap: 13 (ref 5–15)
BUN: 26 mg/dL — ABNORMAL HIGH (ref 8–23)
CO2: 23 mmol/L (ref 22–32)
Calcium: 9.5 mg/dL (ref 8.9–10.3)
Chloride: 103 mmol/L (ref 98–111)
Creatinine, Ser: 2.04 mg/dL — ABNORMAL HIGH (ref 0.61–1.24)
GFR, Estimated: 34 mL/min — ABNORMAL LOW (ref >60)
Glucose, Bld: 90 mg/dL (ref 70–99)
Potassium: 4.3 mmol/L (ref 3.5–5.1)
Sodium: 139 mmol/L (ref 135–145)
Total Bilirubin: 1.3 mg/dL — ABNORMAL HIGH (ref 0.0–1.2)
Total Protein: 7.2 g/dL (ref 6.5–8.1)

## 2023-08-19 LAB — CBC
HCT: 43.1 % (ref 39.0–52.0)
HCT: 41.9 % (ref 39.0–52.0)
Hemoglobin: 14.6 g/dL (ref 13.0–17.0)
Hemoglobin: 14 g/dL (ref 13.0–17.0)
MCH: 32 pg (ref 26.0–34.0)
MCH: 31.7 pg (ref 26.0–34.0)
MCHC: 33.9 g/dL (ref 30.0–36.0)
MCHC: 33.4 g/dL (ref 30.0–36.0)
MCV: 94.5 fL (ref 80.0–100.0)
MCV: 95 fL (ref 80.0–100.0)
Platelets: 169 K/uL (ref 150–400)
Platelets: 165 K/uL (ref 150–400)
RBC: 4.56 MIL/uL (ref 4.22–5.81)
RBC: 4.41 MIL/uL (ref 4.22–5.81)
RDW: 13.2 % (ref 11.5–15.5)
RDW: 13.1 % (ref 11.5–15.5)
WBC: 8.1 K/uL (ref 4.0–10.5)
WBC: 6.3 K/uL (ref 4.0–10.5)
nRBC: 0 % (ref 0.0–0.2)
nRBC: 0 % (ref 0.0–0.2)

## 2023-08-19 LAB — LACTIC ACID, PLASMA
Lactic Acid, Venous: 1.2 mmol/L (ref 0.5–1.9)
Lactic Acid, Venous: 0.9 mmol/L (ref 0.5–1.9)

## 2023-08-19 LAB — URINALYSIS, ROUTINE W REFLEX MICROSCOPIC
Bilirubin Urine: NEGATIVE
Glucose, UA: NEGATIVE mg/dL
Hgb urine dipstick: NEGATIVE
Ketones, ur: 15 mg/dL — AB
Leukocytes,Ua: NEGATIVE
Nitrite: NEGATIVE
Protein, ur: NEGATIVE mg/dL
Specific Gravity, Urine: 1.026 (ref 1.005–1.030)
pH: 5.5 (ref 5.0–8.0)

## 2023-08-19 LAB — CREATININE, SERUM
Creatinine, Ser: 1.66 mg/dL — ABNORMAL HIGH (ref 0.61–1.24)
GFR, Estimated: 44 mL/min — ABNORMAL LOW (ref >60)

## 2023-08-19 LAB — LIPASE, BLOOD: Lipase: 22 U/L (ref 11–51)

## 2023-08-19 MED ORDER — LACTATED RINGERS IV BOLUS
1000.0000 mL | Freq: Once | INTRAVENOUS | Status: AC
  Administered 2023-08-19: 1000 mL via INTRAVENOUS

## 2023-08-19 MED ORDER — SIMVASTATIN 10 MG PO TABS: 10.0000 mg | ORAL_TABLET | Freq: Every day | ORAL | Status: DC

## 2023-08-19 MED ORDER — ACETAMINOPHEN 325 MG PO TABS: 650.0000 mg | ORAL_TABLET | Freq: Four times a day (QID) | ORAL | Status: DC | PRN

## 2023-08-19 MED ORDER — TAMSULOSIN HCL 0.4 MG PO CAPS
0.4000 mg | ORAL_CAPSULE | Freq: Once | ORAL | Status: AC
  Administered 2023-08-19: 0.4 mg via ORAL
  Filled 2023-08-19: qty 1

## 2023-08-19 MED ORDER — LACTATED RINGERS IV SOLN: INTRAVENOUS | Status: DC

## 2023-08-19 MED ORDER — ACETAMINOPHEN 650 MG RE SUPP: 650.0000 mg | Freq: Four times a day (QID) | RECTAL | Status: DC | PRN

## 2023-08-19 MED ORDER — HEPARIN SODIUM (PORCINE) 5000 UNIT/ML IJ SOLN
5000.0000 [IU] | Freq: Three times a day (TID) | INTRAMUSCULAR | Status: DC
  Administered 2023-08-19 – 2023-08-20 (×2): 5000 [IU] via SUBCUTANEOUS
  Filled 2023-08-19 (×2): qty 1

## 2023-08-19 NOTE — ED Notes (Signed)
 Called Kim at [facility] for transport

## 2023-08-19 NOTE — Telephone Encounter (Signed)
 FYI Only or Action Required?: FYI only for provider.  Patient was last seen in primary care on 02/26/2023 by Perri Ronal PARAS, MD.  Called Nurse Triage reporting Abdominal Pain.  Symptoms began a week ago.  Interventions attempted: Nothing.  Symptoms are: unchanged.  Triage Disposition: See Physician Within 24 Hours  Patient/caregiver understands and will follow disposition?:  yes   Copied from CRM [id number]. Topic: Clinical - Red Word Triage >> Aug 19, 2023  9:43 AM Precious C wrote: [facility] that prompted transfer to Nurse Triage: DISCOMFORTING PAIN  Patient called in due to abdominal pain after eating. He stated the pain began about a week ago and described it as mild to heavy, rating it between 2-5 out of 10. The pain is located in the midsection and toward the left side. While attempting to schedule an appointment, the system denied scheduling and prompted a nurse triage referral due to worsening symptoms. Reason for Disposition  [1] MODERATE pain (e.g., interferes with normal activities) AND [2] pain comes and goes (cramps) AND [3] present > 24 hours  (Exception: Pain with Vomiting or Diarrhea - see that Guideline.)  Answer Assessment - Initial Assessment Questions 1. LOCATION: Where does it hurt?     Navel, waist line  2. RADIATION: Does the pain shoot anywhere else? (e.g., chest, back)     no 3. ONSET: When did the pain begin? (Minutes, hours or days ago)      1 week 4. SUDDEN: Gradual or sudden onset?     sudden 5. PATTERN Does the pain come and go, or is it constant?     Comes and goes  6. SEVERITY: How bad is the pain?  (e.g., Scale 1-10; mild, moderate, or severe)     2-5/10 8. CAUSE: What do you think is causing the stomach pain? (e.g., gallstones, recent abdominal surgery)     Eating  9. RELIEVING/AGGRAVATING FACTORS: What makes it better or worse? (e.g., antacids, bending or twisting motion, bowel movement)     eating 10. OTHER SYMPTOMS: Do you  have any other symptoms? (e.g., back pain, diarrhea, fever, urination pain, vomiting)       Occasional nausea  Protocols used: Abdominal Pain - Male-A-AH

## 2023-08-19 NOTE — ED Triage Notes (Addendum)
 Pt reports abdominal discomfort spells off and on for past week, primarily in suprapubic to LLQ region, with occasional queasiness after meals.  Pt seen by PMD this morning and advised to followup in ED for eval.

## 2023-08-19 NOTE — ED Provider Notes (Signed)
 [place] EMERGENCY DEPARTMENT AT [facility] Provider Note   CSN: [id number] Arrival date & time: 08/19/23  1232     Patient presents with: Abdominal Pain   Jerry Campbell is a 73 y.o. male with history of hyperlipidemia presents emerged from today for evaluation of 1 week of intermittent suprapubic pain rating to the left lower quadrant.  He describes it as a dull discomfort.  Reports that the symptoms location varies between its his left lower quadrant or suprapubic area.  Has had some nausea but no vomiting.  Reports the pain between a 2-5 out of 10.  Reports that he feels gets worse 1 to 2 hours after eating but is present without eating as well.  Thought that he may have been constipated as he has not had a bowel movement for the past few days so took 2 stool softeners however did not produce any stool.  Is unsure whether or not he is passing gas or not.  No dysuria or hematuria.  Last colonoscopy was in 2024.  He has had an umbilical hernia repair as well as a lower lumbar surgery.  He denies any allergies to any medications.  Denies tobacco use.  Drinks 1 alcoholic drink per week.  Denies illicit drug use.  Patient reports he does have history with kidney stones however was distant in the past.  Abdominal Pain Associated symptoms: constipation and nausea   Associated symptoms: no chest pain, no chills, no diarrhea, no dysuria, no fever, no hematuria, no shortness of breath and no vomiting        Prior to Admission medications   Medication Sig Start Date End Date Taking? Authorizing Provider  Aspirin-Salicylamide-Caffeine (BC HEADACHE POWDER PO) Take 1 packet by mouth as needed (pain/headache).    [provider]  cholecalciferol (VITAMIN D3) 25 MCG (1000 UNIT) tablet Take 2,000 Units by mouth daily.    [provider]  COCONUT OIL PO Take by mouth. 1000 mg bedtime    [provider]  fish oil-omega-3 fatty acids 1000 MG capsule Take 1 g by mouth  2 (two) times daily. 1200 mg    [provider]  MELATONIN PO Take 1 tablet by mouth as needed. Reported on 02/16/2015    [provider]  Multiple Vitamins-Minerals (MULTIVITAMIN WITH MINERALS) tablet Take 1 tablet by mouth daily.    [provider]  naproxen sodium (ALEVE) 220 MG tablet Take 220 mg by mouth as needed (pain). Reported on 02/16/2015    [provider]  simvastatin  (ZOCOR ) 10 MG tablet TAKE 1 TABLET BY MOUTH EVERYDAY AT BEDTIME 04/01/23   Perri Ronal PARAS, MD  tretinoin (RETIN-A) 0.025 % cream Apply topically at bedtime. 01/16/22   [provider]    Allergies: Patient has no known allergies.    Review of Systems  Constitutional:  Negative for chills and fever.  Respiratory:  Negative for shortness of breath.   Cardiovascular:  Negative for chest pain.  Gastrointestinal:  Positive for abdominal pain, constipation and nausea. Negative for blood in stool, diarrhea and vomiting.  Genitourinary:  Negative for dysuria and hematuria.    Updated Vital Signs BP (!) 147/86   Pulse (!) 56   Temp 98.1 F (36.7 C) (Oral)   Resp 15   SpO2 98%   Physical Exam Vitals and nursing note reviewed.  Constitutional:      General: He is not in acute distress.    Appearance: He is not ill-appearing or toxic-appearing.  Eyes:  General: No scleral icterus. Cardiovascular:     Rate and Rhythm: Normal rate.  Abdominal:     General: There is no distension.     Palpations: Abdomen is soft.     Tenderness: There is no abdominal tenderness. There is no guarding or rebound.     Comments: Abdomen is soft.  Nontender.  No acute overlying skin changes noted.  Skin:    General: Skin is warm and dry.  Neurological:     Mental Status: He is alert.     (all labs ordered are listed, but only abnormal results are displayed) Labs Reviewed  COMPREHENSIVE METABOLIC PANEL WITH GFR - Abnormal; Notable for the following components:      Result Value    BUN 26 (*)    Creatinine, Ser 2.04 (*)    Total Bilirubin 1.3 (*)    GFR, Estimated 34 (*)    All other components within normal limits  URINALYSIS, ROUTINE W REFLEX MICROSCOPIC - Abnormal; Notable for the following components:   Ketones, ur 15 (*)    All other components within normal limits  LIPASE, BLOOD  CBC  LACTIC ACID, PLASMA  LACTIC ACID, PLASMA    EKG: None  Radiology: CT ABDOMEN PELVIS WO CONTRAST Result Date: 08/19/2023 CLINICAL DATA:  Left lower quadrant pain EXAM: CT ABDOMEN AND PELVIS WITHOUT CONTRAST TECHNIQUE: Multidetector CT imaging of the abdomen and pelvis was performed following the standard protocol without IV contrast. RADIATION DOSE REDUCTION: This exam was performed according to the departmental dose-optimization program which includes automated exposure control, adjustment of the mA and/or kV according to patient size and/or use of iterative reconstruction technique. COMPARISON:  None Available. FINDINGS: Lower chest: No acute abnormality. Hepatobiliary: No focal liver abnormality is seen. No gallstones, gallbladder wall thickening, or biliary dilatation. Pancreas: Unremarkable. No pancreatic ductal dilatation or surrounding inflammatory changes. Spleen: Normal in size without focal abnormality. Adrenals/Urinary Tract: There is a 3 mm calculus in the bladder at the left ureterovesicular junction. There is mild left-sided hydroureteronephrosis with perinephric fat stranding. There are additional punctate nonobstructing right renal calculi. There is no right-sided hydronephrosis. The adrenal glands and bladder are otherwise within normal limits. Stomach/Bowel: Stomach is within normal limits. Appendix appears normal. No evidence of bowel wall thickening, distention, or inflammatory changes. Vascular/Lymphatic: Aortic atherosclerosis. No enlarged abdominal or pelvic lymph nodes. Reproductive: Prostate gland is enlarged. Other: There is a small fat containing left inguinal  hernia. There is no ascites. Musculoskeletal: There are degenerative changes of the spine.  Is IMPRESSION: 1. 3 mm calculus in the bladder at the left ureterovesicular junction with mild left-sided hydroureteronephrosis. 2. Additional nonobstructing right renal calculi. 3. Aortic atherosclerosis. Aortic Atherosclerosis (ICD10-I70.0). Electronically Signed   By: Greig Pique M.D.   On: 08/19/2023 15:28    Procedures   Medications Ordered in the ED  lactated ringers  infusion ( Intravenous New Bag/Given 08/19/23 1640)  lactated ringers  bolus 1,000 mL (0 mLs Intravenous Stopped 08/19/23 1644)  lactated ringers  bolus 1,000 mL (0 mLs Intravenous Stopped 08/19/23 1826)                               Medical Decision Making Amount and/or Complexity of Data Reviewed Labs: ordered. Radiology: ordered.  Risk Prescription drug management. Decision regarding hospitalization.   73 y.o. male presents to the ER for evaluation of LLQ pain. Differential diagnosis includes but is not limited to AAA, mesenteric ischemia, appendicitis, diverticulitis, DKA, gastroenteritis,  nephrolithiasis, pancreatitis, constipation, UTI, bowel obstruction, biliary disease, IBD, PUD, hepatitis, testicular torsion . Vital signs blood pressure 147/86, pulse rate at 58, otherwise unremarkable. Physical exam as noted above.   Will order CT abdomen as well as labs.  Patient does not appear in any acute distress.  Abdomen soft and nontender.  Could be some gaseous process, nephrolithiasis, small bowel obstruction, diverticulitis.  I independently reviewed and interpreted the patient's labs.  Urinalysis shows 15 ketones otherwise no other signs of infection.  Lipase within normal limits.  CBC with a leukocytosis or anemia.  Lactic acid within normal limits.  CMP does show a creatinine of 2.04 which is elevated from patient's baseline around 1.1.  BUN is elevated 26 as well.  Total bili at 1.3.  No other electrolyte or LFT  abnormalities.  Initially planned on doing CT angio of the chest however given the patient's AKI, CT Noncon ordered.  CT imaging shows 1. 3 mm calculus in the bladder at the left ureterovesicular junction with mild left-sided hydroureteronephrosis. 2. Additional nonobstructing right renal calculi. 3. Aortic atherosclerosis. Per radiologist's interpretation.    I consulted urology and spoke with Dr. Roseann.  Think is reasonable to have him admitted for observation for trending of his AKI.  Does not think any emergent urological surgery needed at this time.  Will admit the patient for further rehydration and serial checks of creatinine to see that his numbers are improving.  Discussed with patient and family at bedside who are in agreement to the plan.  I discussed this case with my attending physician who cosigned this note including patient's presenting symptoms, physical exam, and planned diagnostics and interventions. Attending physician stated agreement with plan or made changes to plan which were implemented.   Portions of this report may have been transcribed using voice recognition software. Every effort was made to ensure accuracy; however, inadvertent computerized transcription errors may be present.    Final diagnoses:  AKI (acute kidney injury) [facility])  Nephrolithiasis    ED Discharge Orders     None          Bernis Ernst, [place] 08/19/23 1929    Ula Prentice SAUNDERS, MD 08/21/23 [phone]

## 2023-08-19 NOTE — H&P (Signed)
 History and Physical    Jerry Campbell FMW:[id number] DOB: March 01, 1950 DOA: 08/19/2023  Patient coming from: Home.  Chief Complaint: Left flank pain.  HPI: Jerry Campbell is a 73 y.o. male with history of hypertension and prior history of nephrolithiasis many years ago has been experiencing left flank and lower quadrant pain for almost a week now.  Denies any hematuria dysuria nausea or vomiting.  Due to persistent pain patient had presented to her primary care physician who referred patient to the ER for imaging.  ED Course: CT abdomen pelvis shows left-sided 3 mm stone in the bladder around the ureterovesical junction with mild left-sided hydronephrosis.  Labs show acute renal failure with creatinine worsening from 1.1 and February 25, 2023 and it is around 2.04 now.  ER physician discussed with Dr. Roseann urologist who advised admission for observation for acute renal failure.  Review of Systems: As per HPI, rest all negative.   Past Medical History:  Diagnosis Date   Anxiety    takes Valium  prn   Bronchitis    hx of-last time being about a yr ago   Cataract    developing   Chronic back pain    herniated disc,lumbago,spondylosis   Confusion    hx of-2008   Constipation    d/t meds;taking an OTC stool softener daily(for the past 2 wks)   DDD (degenerative disc disease)    History of kidney stones 25+yrs ago   Hyperlipidemia    takes Simvastatin  daily   Hyperplastic colon polyp    Transient global amnesia     Past Surgical History:  Procedure Laterality Date   COLONOSCOPY     LUMBAR LAMINECTOMY/DECOMPRESSION MICRODISCECTOMY  04/09/2011   Procedure: LUMBAR LAMINECTOMY/DECOMPRESSION MICRODISCECTOMY;  Surgeon: Victory Gens, MD;  Location: [facility];  Service: Neurosurgery;  Laterality: Left;  Left Lumbar five - sacral one Microdiskectomy   UMBILICAL HERNIA REPAIR  10/10/2010   VASECTOMY  56yrs ago   wisdom teeth extracted  early 80's     reports that he has never  smoked. He has never used smokeless tobacco. He reports current alcohol use of about 2.0 standard drinks of alcohol per week. He reports that he does not use drugs.  No Known Allergies  Family History  Problem Relation Age of Onset   Heart disease Mother    Hypertension Mother    Breast cancer Mother    Prostate cancer Father    Heart disease Brother    Colon cancer Paternal Uncle    Diabetes Paternal Grandmother    Stroke Paternal Grandmother    Anesthesia problems Neg Hx    Hypotension Neg Hx    Malignant hyperthermia Neg Hx    Pseudochol deficiency Neg Hx    Esophageal cancer Neg Hx    Stomach cancer Neg Hx    Rectal cancer Neg Hx    Colon polyps Neg Hx    Crohn's disease Neg Hx    Ulcerative colitis Neg Hx     Prior to Admission medications   Medication Sig Start Date End Date Taking? Authorizing Provider  Aspirin-Salicylamide-Caffeine (BC HEADACHE POWDER PO) Take 1 packet by mouth as needed (pain/headache).    [provider]  cholecalciferol (VITAMIN D3) 25 MCG (1000 UNIT) tablet Take 2,000 Units by mouth daily.    [provider]  COCONUT OIL PO Take by mouth. 1000 mg bedtime    [provider]  fish oil-omega-3 fatty acids 1000 MG capsule Take 1 g by mouth 2 (  two) times daily. 1200 mg    [provider]  MELATONIN PO Take 1 tablet by mouth as needed. Reported on 02/16/2015    [provider]  Multiple Vitamins-Minerals (MULTIVITAMIN WITH MINERALS) tablet Take 1 tablet by mouth daily.    [provider]  naproxen sodium (ALEVE) 220 MG tablet Take 220 mg by mouth as needed (pain). Reported on 02/16/2015    [provider]  simvastatin  (ZOCOR ) 10 MG tablet TAKE 1 TABLET BY MOUTH EVERYDAY AT BEDTIME 04/01/23   Perri Ronal PARAS, MD  tretinoin (RETIN-A) 0.025 % cream Apply topically at bedtime. 01/16/22   [provider]    Physical Exam: Constitutional: Moderately built and nourished. Vitals:   08/19/23  1545 08/19/23 1600 08/19/23 1814 08/19/23 1927  BP:    (!) 171/92  Pulse: (!) 50 (!) 56  61  Resp: 13 15  18   Temp:   98.1 F (36.7 C) 98.4 F (36.9 C)  TempSrc:   Oral Oral  SpO2: 99% 98%  98%   Eyes: Anicteric no pallor. ENMT: No discharge from the ears eyes nose and mouth. Neck: No mass felt.  No neck related. Respiratory: No rhonchi or crepitations. Cardiovascular: S1-S2 heard. Abdomen: Soft nontender bowel sound present. Musculoskeletal: No edema. Skin: No rash. Neurologic: Alert awake oriented time place and person.  Moves all extremities. Psychiatric: Appears normal.  Normal affect.   Labs on Admission: I have personally reviewed following labs and imaging studies  CBC: Recent Labs  Lab 08/19/23 1300  WBC 8.1  HGB 14.6  HCT 43.1  MCV 94.5  PLT 169   Basic Metabolic Panel: Recent Labs  Lab 08/19/23 1300  NA 139  K 4.3  CL 103  CO2 23  GLUCOSE 90  BUN 26*  CREATININE 2.04*  CALCIUM 9.5   GFR: Estimated Creatinine Clearance: 32.1 mL/min (A) (by C-G formula based on SCr of 2.04 mg/dL (H)). Liver Function Tests: Recent Labs  Lab 08/19/23 1300  AST 27  ALT 18  ALKPHOS 57  BILITOT 1.3*  PROT 7.2  ALBUMIN 4.3   Recent Labs  Lab 08/19/23 1300  LIPASE 22   No results for input(s): AMMONIA in the last 168 hours. Coagulation Profile: No results for input(s): INR, PROTIME in the last 168 hours. Cardiac Enzymes: No results for input(s): CKTOTAL, CKMB, CKMBINDEX, TROPONINI in the last 168 hours. BNP (last 3 results) No results for input(s): PROBNP in the last 8760 hours. HbA1C: No results for input(s): HGBA1C in the last 72 hours. CBG: No results for input(s): GLUCAP in the last 168 hours. Lipid Profile: No results for input(s): CHOL, HDL, LDLCALC, TRIG, CHOLHDL, LDLDIRECT in the last 72 hours. Thyroid Function Tests: No results for input(s): TSH, T4TOTAL, FREET4, T3FREE, THYROIDAB in the last 72  hours. Anemia Panel: No results for input(s): VITAMINB12, FOLATE, FERRITIN, TIBC, IRON, RETICCTPCT in the last 72 hours. Urine analysis:    Component Value Date/Time   COLORURINE YELLOW 08/19/2023 1249   APPEARANCEUR CLEAR 08/19/2023 1249   LABSPEC 1.026 08/19/2023 1249   PHURINE 5.5 08/19/2023 1249   GLUCOSEU NEGATIVE 08/19/2023 1249   HGBUR NEGATIVE 08/19/2023 1249   BILIRUBINUR NEGATIVE 08/19/2023 1249   BILIRUBINUR negative 02/26/2023 1140   BILIRUBINUR neg 02/22/2022 1034   KETONESUR 15 (A) 08/19/2023 1249   PROTEINUR NEGATIVE 08/19/2023 1249   UROBILINOGEN 0.2 02/26/2023 1140   NITRITE NEGATIVE 08/19/2023 1249   LEUKOCYTESUR NEGATIVE 08/19/2023 1249   Sepsis Labs: @LABRCNTIP (procalcitonin:4,lacticidven:4) )No results found for this or  any previous visit (from the past 240 hours).   Radiological Exams on Admission: CT ABDOMEN PELVIS WO CONTRAST Result Date: 08/19/2023 CLINICAL DATA:  Left lower quadrant pain EXAM: CT ABDOMEN AND PELVIS WITHOUT CONTRAST TECHNIQUE: Multidetector CT imaging of the abdomen and pelvis was performed following the standard protocol without IV contrast. RADIATION DOSE REDUCTION: This exam was performed according to the departmental dose-optimization program which includes automated exposure control, adjustment of the mA and/or kV according to patient size and/or use of iterative reconstruction technique. COMPARISON:  None Available. FINDINGS: Lower chest: No acute abnormality. Hepatobiliary: No focal liver abnormality is seen. No gallstones, gallbladder wall thickening, or biliary dilatation. Pancreas: Unremarkable. No pancreatic ductal dilatation or surrounding inflammatory changes. Spleen: Normal in size without focal abnormality. Adrenals/Urinary Tract: There is a 3 mm calculus in the bladder at the left ureterovesicular junction. There is mild left-sided hydroureteronephrosis with perinephric fat stranding. There are additional punctate  nonobstructing right renal calculi. There is no right-sided hydronephrosis. The adrenal glands and bladder are otherwise within normal limits. Stomach/Bowel: Stomach is within normal limits. Appendix appears normal. No evidence of bowel wall thickening, distention, or inflammatory changes. Vascular/Lymphatic: Aortic atherosclerosis. No enlarged abdominal or pelvic lymph nodes. Reproductive: Prostate gland is enlarged. Other: There is a small fat containing left inguinal hernia. There is no ascites. Musculoskeletal: There are degenerative changes of the spine.  Is IMPRESSION: 1. 3 mm calculus in the bladder at the left ureterovesicular junction with mild left-sided hydroureteronephrosis. 2. Additional nonobstructing right renal calculi. 3. Aortic atherosclerosis. Aortic Atherosclerosis (ICD10-I70.0). Electronically Signed   By: Greig Pique M.D.   On: 08/19/2023 15:28    Assessment/Plan Principal Problem:   AKI (acute kidney injury) (HCC) Active Problems:   HLD (hyperlipidemia)   Ureteral stone with hydronephrosis   ARF (acute renal failure) (HCC)    Acute renal failure with mild left-sided hydronephrosis and looks like stone is already in the urinary bladder.-     creatinine increased from February of this year.  Could be from obstruction.  Continue with fluids follow metabolic panel.  Discussed with Dr. Roseann advise follow-up as outpatient if creatinine improves. Hyperlipidemia on statins.  EKG is ordered since patient has mild bradycardia.  Which is pending.   Since patient has acute renal failure with hydronephrosis will need further workup and more than 2 midnight stay.   DVT prophylaxis: Heparin . Code Status: Full code. Family Communication: Discussed with patient. Disposition Plan: Medical floor. Consults called: Discussed with Dr. Roseann.  Urologist. Admission status: Patient.

## 2023-08-19 NOTE — Progress Notes (Addendum)
 [facility] transfer acceptance note  Transferring facility: [facility] Transferring provider: Carlo Cornish, PA-C Accepted to: [facility], medical bed, observation status  73 year old male with history of hyperlipidemia, presented to the ED with complaints of 1 week history of intermittent suprapubic pain, left lower quadrant pain.  Seen by PCP earlier this morning and directed to ED.  In ED, afebrile, bradycardia in the 50s, intermittent hypertension up to 157/93, not hypoxic.  Lab work significant for BUN 26, creatinine 2.04 (1.17 on 02/25/2023), total bilirubin 1.3.  Rest of CMP and CBC are normal.  Lactate 1.2.  Urine microscopy only shows 15 ketones but otherwise negative.  CT A/P without contrast: 3 mm calculus in the bladder at the left ureterovesicular junction with mild left-sided hydroureteronephrosis.Additional nonobstructing right renal calculi.  Thus far has received 2.5 L of IV fluid bolus followed by maintenance IV fluids at 125 mL/h  Assessment and plan Acute kidney injury, rule out obstructive uropathy from reported left UVJ stone and mild left-sided hydronephrosis +/- dehydration Per EDP, they have discussed case with Dr. Roseann, Urologist who will review imaging Continue IVF, needs admit orders and H&P when he arrives to the hospital and admitting physician to decide on formal urology consultation  Trenda Mar, MD,  FACP, [facility], [facility], [facility]   [facility] [place]     To contact the attending provider between 7A-7P or the covering provider during after hours 7P-7A, please log into the web site [email or web address] and access using universal [place] password for that web site. If you do not have the password, please call the hospital operator.

## 2023-08-19 NOTE — Progress Notes (Signed)
 Patient Care Team: Perri Ronal PARAS, MD as PCP - General (Internal Medicine)  Visit Date: 08/19/23  Subjective:   Chief Complaint  Patient presents with   Abdominal Pain    Abdominal discomfort for a week.    Vitals:   08/19/23 1012 08/19/23 1022 08/19/23 1024  BP: (!) 160/90 (!) 160/100 (!) 140/100   Patient PI:Jerry Campbell,Male DOB:1950/08/05,73 y.o. MRN:[id number]   73 y.o.Male presents today for acute visit with Abdominal Pain. Patient has a past medical history of Anxiety. Says that he's had mid-lower to left-side abdominal pain for the past week; the first instance this occurred woke him up from his sleep, but has noticed that is generally occurs shortly after eating. He says that yesterday he used 3 OTC stool softeners in case he was constipated, though he doesn't have any hx of constipation and his bowel movements have been fairly regular, occurring every other day, except his last good BM was about 3 days ago. Also notes, yesterday he did have a brief transitory instance of chills, but otherwise denies fever or nausea/vomiting.  Copied from a note from 02/26/2023 due to relevance: Colonoscopy 03/13/22 performed by Elspeth Naval, MD with 3 polyps removed - 1 sessile diminutive in the cecum and two sessile 3 mm in sigmoid (at least 1 of these polyps was found to be adenomatous); Anal papilla(e) hypertrophied; Internal hemorrhoids.   Elevated Blood Pressure initially of 160/90, 10 minutes later 160/100, and 2 minutes following that 140/100.  Past Medical History:  Diagnosis Date   Anxiety    takes Valium  prn   Bronchitis    hx of-last time being about a yr ago   Cataract    developing   Chronic back pain    herniated disc,lumbago,spondylosis   Confusion    hx of-2008   Constipation    d/t meds;taking an OTC stool softener daily(for the past 2 wks)   DDD (degenerative disc disease)    History of kidney stones 25+yrs ago   Hyperlipidemia    takes Simvastatin  daily    Hyperplastic colon polyp    Transient global amnesia   No Known Allergies  Family History  Problem Relation Age of Onset   Heart disease Mother    Hypertension Mother    Breast cancer Mother    Prostate cancer Father    Heart disease Brother    Colon cancer Paternal Uncle    Diabetes Paternal Grandmother    Stroke Paternal Grandmother    Anesthesia problems Neg Hx    Hypotension Neg Hx    Malignant hyperthermia Neg Hx    Pseudochol deficiency Neg Hx    Esophageal cancer Neg Hx    Stomach cancer Neg Hx    Rectal cancer Neg Hx    Colon polyps Neg Hx    Crohn's disease Neg Hx    Ulcerative colitis Neg Hx    Social History   Social History Narrative   Right handed.  Caffeine 1 cup avg daily.  Retired.  Married, 1 daughter.  Graduate school.      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  He is married.  Patient's wife formally worked as a Designer, jewellery in Cardiology at the hospital but retired due to health issues.       Family history: Mother died of complications of dementia.  She also had glaucoma, hypertension and hypothyroidism.  1 brother with history of MI in his early 64s.  1 sister  with history of disabilities passed away after having acute abdominal issue requiring surgery.       Review of Systems  Constitutional:  Positive for chills (1 instance). Negative for fever.  Gastrointestinal:  Positive for abdominal pain. Negative for nausea and vomiting.     Objective:  Vitals: body mass index is 23.42 kg/m. Today's Vitals   08/19/23 1012 08/19/23 1022 08/19/23 1024  BP: (!) 160/90 (!) 160/100 (!) 140/100  Pulse: 66    Temp: 97.9 F (36.6 C)    SpO2: 97%    Weight: 154 lb 0.1 oz (69.9 kg)    Height: 5' 8 (1.727 m)    PainSc: 1     PainLoc: Abdomen     Physical Exam Vitals and nursing note reviewed.  Constitutional:      General: He is not in acute distress.    Appearance: Normal appearance. He is not ill-appearing.  HENT:      Head: Normocephalic and atraumatic.  Pulmonary:     Effort: Pulmonary effort is normal.  Abdominal:     General: Bowel sounds are normal.     Palpations: There is no hepatomegaly or splenomegaly.     Tenderness: There is abdominal tenderness (Lower abdomen; more significant on LEFT vs RIGHT).  Skin:    General: Skin is warm and dry.  Neurological:     Mental Status: He is alert and oriented to person, place, and time. Mental status is at baseline.  Psychiatric:        Mood and Affect: Mood normal.        Behavior: Behavior normal.        Thought Content: Thought content normal.        Judgment: Judgment normal.     Results:  Studies Obtained And Personally Reviewed By Me:  Copied from a note from 02/26/2023 due to relevance:Colonoscopy 03/13/22 performed by Elspeth Naval, MD with 3 polyps removed - 1 sessile diminutive in the cecum and two sessile 3 mm in sigmoid (at least 1 of these polyps was found to be adenomatous); Anal papilla(e) hypertrophied; Internal hemorrhoids.   Labs:     Component Value Date/Time   NA 144 02/25/2023 1138   K 4.6 02/25/2023 1138   CL 109 02/25/2023 1138   CO2 29 02/25/2023 1138   GLUCOSE 91 02/25/2023 1138   BUN 23 02/25/2023 1138   CREATININE 1.17 02/25/2023 1138   CALCIUM 9.3 02/25/2023 1138   PROT 6.4 02/25/2023 1138   ALBUMIN 4.3 12/19/2015 1216   AST 19 02/25/2023 1138   ALT 18 02/25/2023 1138   ALKPHOS 40 12/19/2015 1216   BILITOT 0.6 02/25/2023 1138   GFRNONAA 62 02/15/2020 0911   GFRAA 72 02/15/2020 0911    Lab Results  Component Value Date   WBC 4.2 02/25/2023   HGB 15.5 02/25/2023   HCT 47.1 02/25/2023   MCV 96.3 02/25/2023   PLT 196 02/25/2023   Lab Results  Component Value Date   CHOL 142 02/25/2023   HDL 52 02/25/2023   LDLCALC 77 02/25/2023   TRIG 53 02/25/2023   CHOLHDL 2.7 02/25/2023   Lab Results  Component Value Date   PSA 0.60 02/25/2023   PSA 0.61 02/19/2022   PSA 0.66 02/20/2021     Assessment &  Plan:   Lower Abdominal Pain: reportedly has had mid-lower to left-side abdominal pain for the past week; the first instance this occurred woke him up from his sleep, but has noticed that is generally occurs shortly  after eating. Yesterday he used 3 OTC stool softeners to relieve any possible constipation that may be contributing, though he reportedly does not have hx of constipation and his bowel movements have always been fairly regular - occurring every other day, except his last good BM was about 3 days ago. Notably, apparently yesterday he did have a brief transitory instance of chills, but otherwise denies fever or nausea/vomiting. Reviewed his most recent colonoscopy on 03/13/22, which was significant for 1 adenomatous polyp and internal hemorrhoids. On exam palpable lower abdominal tenderness, more significant on left side than right.  This is hard to sort out. He may just have constipation but his wife is a former CCU nurse and he is anxious about this.  Could have diverticulitis. He looks good and does not appear to be volume depleted. We are not able to get urgent outpatient imaging at [facility] so referring to ED. Plan: Recommended he present to ED for imaging.  Elevated Blood Pressure initially of 160/90, 10 minutes later 160/100, and 2 minutes following that 140/100. Feel this is due to anxiety. BP is usually very good.  Hx hyperlipidemia on statin    I,Emily Lagle,acting as a scribe for Ronal JINNY Hailstone, MD.,have documented all relevant documentation on the behalf of Ronal JINNY Hailstone, MD,as directed by  Ronal JINNY Hailstone, MD while in the presence of Ronal JINNY Hailstone, MD.   I, Ronal JINNY Hailstone, MD, have reviewed all documentation for this visit. The documentation on 08/19/23 for the exam, diagnosis, procedures, and orders are all accurate and complete.

## 2023-08-20 ENCOUNTER — Telehealth: Payer: Self-pay | Admitting: Internal Medicine

## 2023-08-20 DIAGNOSIS — E785 Hyperlipidemia, unspecified: Secondary | ICD-10-CM | POA: Diagnosis not present

## 2023-08-20 DIAGNOSIS — N179 Acute kidney failure, unspecified: Secondary | ICD-10-CM | POA: Diagnosis not present

## 2023-08-20 DIAGNOSIS — N132 Hydronephrosis with renal and ureteral calculous obstruction: Secondary | ICD-10-CM

## 2023-08-20 DIAGNOSIS — I10 Essential (primary) hypertension: Secondary | ICD-10-CM | POA: Diagnosis not present

## 2023-08-20 LAB — BASIC METABOLIC PANEL WITH GFR
Anion gap: 10 (ref 5–15)
BUN: 25 mg/dL — ABNORMAL HIGH (ref 8–23)
CO2: 28 mmol/L (ref 22–32)
Calcium: 9 mg/dL (ref 8.9–10.3)
Chloride: 105 mmol/L (ref 98–111)
Creatinine, Ser: 1.64 mg/dL — ABNORMAL HIGH (ref 0.61–1.24)
GFR, Estimated: 44 mL/min — ABNORMAL LOW (ref >60)
Glucose, Bld: 87 mg/dL (ref 70–99)
Potassium: 4.4 mmol/L (ref 3.5–5.1)
Sodium: 143 mmol/L (ref 135–145)

## 2023-08-20 LAB — CBC
HCT: 41.8 % (ref 39.0–52.0)
Hemoglobin: 13.9 g/dL (ref 13.0–17.0)
MCH: 31.9 pg (ref 26.0–34.0)
MCHC: 33.3 g/dL (ref 30.0–36.0)
MCV: 95.9 fL (ref 80.0–100.0)
Platelets: 178 K/uL (ref 150–400)
RBC: 4.36 MIL/uL (ref 4.22–5.81)
RDW: 13.1 % (ref 11.5–15.5)
WBC: 5.3 K/uL (ref 4.0–10.5)
nRBC: 0 % (ref 0.0–0.2)

## 2023-08-20 NOTE — Plan of Care (Signed)

## 2023-08-20 NOTE — Progress Notes (Signed)
   08/20/23 0859  TOC Brief Assessment  Insurance and Status Reviewed  Patient has primary care physician Yes  Home environment has been reviewed single family home  Prior level of function: independent  Prior/Current Home Services No current home services  Social Drivers of Health Review SDOH reviewed no interventions necessary  Readmission risk has been reviewed Yes  Transition of care needs no transition of care needs at this time    Heather Saltness, MSW, LCSW Clinical Social Worker Inpatient Care Management 08/20/2023 8:59 AM

## 2023-08-20 NOTE — Discharge Instructions (Signed)
 Avoid NSAIDs, ibuprofen, naproxen till your kidney function improves

## 2023-08-20 NOTE — Discharge Summary (Signed)
 Physician Discharge Summary   Patient: Jerry Campbell MRN: [id number] DOB: April 27, 1950  Admit date:     08/19/2023  Discharge date: 08/20/23  Discharge Physician: Sabas GORMAN Brod   PCP: Perri Ronal PARAS, MD   Recommendations at discharge:   Follow-up PCP in 1 week Follow-up with [facility] as outpatient Check BMP in 1 week at PCP office Avoid NSAIDs till renal function improves  Discharge Diagnoses: Principal Problem:   AKI (acute kidney injury) (HCC) Active Problems:   HLD (hyperlipidemia)   Ureteral stone with hydronephrosis   ARF (acute renal failure) (HCC)  Resolved Problems:   * No resolved hospital problems. *  Hospital Course:   73 y.o. male with history of hypertension and prior history of nephrolithiasis many years ago has been experiencing left flank and lower quadrant pain for almost a week now.  Denies any hematuria dysuria nausea or vomiting.  Due to persistent pain patient had presented to her primary care physician who referred patient to the ER for imaging.   ED Course: CT abdomen pelvis shows left-sided 3 mm stone in the bladder around the ureterovesical junction with mild left-sided hydronephrosis.  Labs show acute renal failure with creatinine worsening from 1.1 and February 25, 2023 and it is around 2.04 now.  ER physician discussed with Dr. Roseann urologist who advised admission for observation for acute renal failure.  Assessment and Plan:  Acute renal failure with mild left-sided hydronephrosis and looks like stone is already in the urinary bladder.-     creatinine increased from February of this year.  Could be from obstruction.  Creatinine has improved with IV fluids.  Creatinine is down to 1.60.   Discussed with Dr. Lovie, urology.  He reviewed CT images.  Recommends outpatient follow-up with [facility].    Left-sided kidney stone-3 mm kidney stone noted on the CT which was in the bladder around ureterovesical junction.  Will likely pass of its  own.  Renal function showed improvement.  Right-sided kidney stones-punctate right-sided kidney stones noted on CT abdomen/pelvis.  Patient can follow-up with urology as outpatient.       Hyperlipidemia on statins.          Consultants: Urology Procedures performed:  Disposition: Home Diet recommendation:  Discharge Diet Orders (From admission, onward)     Start     Ordered   08/20/23 0000  Diet - low sodium heart healthy        08/20/23 1211           Regular diet DISCHARGE MEDICATION: Allergies as of 08/20/2023   No Known Allergies      Medication List     STOP taking these medications    BC HEADACHE POWDER PO   naproxen sodium 220 MG tablet Commonly known as: ALEVE       TAKE these medications    cholecalciferol 25 MCG (1000 UNIT) tablet Commonly known as: VITAMIN D3 Take 2,000 Units by mouth daily.   COCONUT OIL PO Take by mouth. 1000 mg bedtime   fish oil-omega-3 fatty acids 1000 MG capsule Take 1 g by mouth 2 (two) times daily. 1200 mg   MELATONIN PO Take 1 tablet by mouth as needed. Reported on 02/16/2015   multivitamin with minerals tablet Take 1 tablet by mouth daily.   simvastatin  10 MG tablet Commonly known as: ZOCOR  TAKE 1 TABLET BY MOUTH EVERYDAY AT BEDTIME   tretinoin 0.025 % cream Commonly known as: RETIN-A Apply topically at bedtime.  Follow-up Information     Baxley, Ronal PARAS, MD Follow up in 1 week(s).   Specialty: Internal Medicine Why: Check BMP at PCP office in 1 week Contact information: [address] [place] [address] [phone]         [facility]. Schedule an appointment as soon as possible for a visit.   Why: For hospital follow-up.  Discussed with Dr. Lovie, who recommended to follow-up with [facility] as outpatient. Contact information: [address]  [place]  [address] [phone]               Discharge Exam: Fredricka Weights    08/20/23 0631  Weight: 70.7 kg   General-appears in no acute distress Heart-S1-S2, regular, no murmur auscultated Lungs-clear to auscultation bilaterally, no wheezing or crackles auscultated Abdomen-soft, nontender, no organomegaly Extremities-no edema in the lower extremities Neuro-alert, oriented x3, no focal deficit noted  Condition at discharge: good  The results of significant diagnostics from this hospitalization (including imaging, microbiology, ancillary and laboratory) are listed below for reference.   Imaging Studies: CT ABDOMEN PELVIS WO CONTRAST Result Date: 08/19/2023 CLINICAL DATA:  Left lower quadrant pain EXAM: CT ABDOMEN AND PELVIS WITHOUT CONTRAST TECHNIQUE: Multidetector CT imaging of the abdomen and pelvis was performed following the standard protocol without IV contrast. RADIATION DOSE REDUCTION: This exam was performed according to the departmental dose-optimization program which includes automated exposure control, adjustment of the mA and/or kV according to patient size and/or use of iterative reconstruction technique. COMPARISON:  None Available. FINDINGS: Lower chest: No acute abnormality. Hepatobiliary: No focal liver abnormality is seen. No gallstones, gallbladder wall thickening, or biliary dilatation. Pancreas: Unremarkable. No pancreatic ductal dilatation or surrounding inflammatory changes. Spleen: Normal in size without focal abnormality. Adrenals/Urinary Tract: There is a 3 mm calculus in the bladder at the left ureterovesicular junction. There is mild left-sided hydroureteronephrosis with perinephric fat stranding. There are additional punctate nonobstructing right renal calculi. There is no right-sided hydronephrosis. The adrenal glands and bladder are otherwise within normal limits. Stomach/Bowel: Stomach is within normal limits. Appendix appears normal. No evidence of bowel wall thickening, distention, or inflammatory changes. Vascular/Lymphatic: Aortic  atherosclerosis. No enlarged abdominal or pelvic lymph nodes. Reproductive: Prostate gland is enlarged. Other: There is a small fat containing left inguinal hernia. There is no ascites. Musculoskeletal: There are degenerative changes of the spine.  Is IMPRESSION: 1. 3 mm calculus in the bladder at the left ureterovesicular junction with mild left-sided hydroureteronephrosis. 2. Additional nonobstructing right renal calculi. 3. Aortic atherosclerosis. Aortic Atherosclerosis (ICD10-I70.0). Electronically Signed   By: Greig Pique M.D.   On: 08/19/2023 15:28    Microbiology: Results for orders placed or performed during the hospital encounter of 04/06/11  Surgical pcr screen     Status: None   Collection Time: 04/06/11  1:51 PM   Specimen: Nasal Mucosa; Nasal Swab  Result Value Ref Range Status   MRSA, PCR NEGATIVE NEGATIVE Final   Staphylococcus aureus NEGATIVE NEGATIVE Final    Comment:        The Xpert SA Assay (FDA approved for NASAL specimens only), is one component of a comprehensive surveillance program.  It is not intended to diagnose infection nor to guide or monitor treatment.    Labs: CBC: Recent Labs  Lab 08/19/23 1300 08/19/23 2113 08/20/23 0510  WBC 8.1 6.3 5.3  HGB 14.6 14.0 13.9  HCT 43.1 41.9 41.8  MCV 94.5 95.0 95.9  PLT 169 165 178   Basic Metabolic  Panel: Recent Labs  Lab 08/19/23 1300 08/19/23 2113 08/20/23 0510  NA 139  --  143  K 4.3  --  4.4  CL 103  --  105  CO2 23  --  28  GLUCOSE 90  --  87  BUN 26*  --  25*  CREATININE 2.04* 1.66* 1.64*  CALCIUM 9.5  --  9.0   Liver Function Tests: Recent Labs  Lab 08/19/23 1300  AST 27  ALT 18  ALKPHOS 57  BILITOT 1.3*  PROT 7.2  ALBUMIN 4.3   CBG: No results for input(s): GLUCAP in the last 168 hours.  Discharge time spent: greater than 30 minutes.  Signed: Sabas GORMAN Brod, MD [facility] 08/20/2023

## 2023-08-22 DIAGNOSIS — N202 Calculus of kidney with calculus of ureter: Secondary | ICD-10-CM | POA: Diagnosis not present

## 2023-08-22 DIAGNOSIS — Z8042 Family history of malignant neoplasm of prostate: Secondary | ICD-10-CM | POA: Diagnosis not present

## 2023-08-27 ENCOUNTER — Ambulatory Visit: Payer: Self-pay | Admitting: Internal Medicine

## 2023-08-27 ENCOUNTER — Encounter: Payer: Self-pay | Admitting: Internal Medicine

## 2023-08-27 ENCOUNTER — Ambulatory Visit: Admitting: Internal Medicine

## 2023-08-27 VITALS — BP 150/100 | HR 68 | Temp 98.3°F | Ht 68.0 in | Wt 155.0 lb

## 2023-08-27 DIAGNOSIS — N132 Hydronephrosis with renal and ureteral calculous obstruction: Secondary | ICD-10-CM

## 2023-08-27 DIAGNOSIS — N179 Acute kidney failure, unspecified: Secondary | ICD-10-CM

## 2023-08-27 DIAGNOSIS — R7989 Other specified abnormal findings of blood chemistry: Secondary | ICD-10-CM | POA: Diagnosis not present

## 2023-08-27 DIAGNOSIS — R799 Abnormal finding of blood chemistry, unspecified: Secondary | ICD-10-CM

## 2023-08-27 DIAGNOSIS — Z09 Encounter for follow-up examination after completed treatment for conditions other than malignant neoplasm: Secondary | ICD-10-CM

## 2023-08-27 DIAGNOSIS — E7800 Pure hypercholesterolemia, unspecified: Secondary | ICD-10-CM | POA: Diagnosis not present

## 2023-08-27 LAB — POCT URINALYSIS DIP (CLINITEK)
Bilirubin, UA: NEGATIVE
Blood, UA: NEGATIVE
Glucose, UA: NEGATIVE mg/dL
Ketones, POC UA: NEGATIVE mg/dL
Leukocytes, UA: NEGATIVE
Nitrite, UA: NEGATIVE
POC PROTEIN,UA: NEGATIVE
Spec Grav, UA: 1.01 (ref 1.010–1.025)
Urobilinogen, UA: 0.2 U/dL
pH, UA: 6.5 (ref 5.0–8.0)

## 2023-08-27 NOTE — Progress Notes (Signed)
 Patient Care Team: Perri Ronal PARAS, MD as PCP - General (Internal Medicine)  Visit Date: 08/27/23  Subjective:   Chief Complaint  Patient presents with   Hospitalization Follow-up   Vitals:   08/27/23 1115 08/27/23 1125 08/27/23 1136 08/27/23 1200  BP: (!) 150/90 (!) 140/90 (!) 140/90 (!) 150/100   Patient PI:Jerry Campbell,Male DOB:09-Sep-1950,73 y.o. MRN:[id number]   73 y.o.Male presents today for hospital follow-up from 08/19/2023. Patient has a past medical history of Anxiety; Hx of Kidney Stones. Initially seen in this office on 7/28 w/ complaints of lower left abdominal pain ongoing for 1 week. Though his condition did not seem serious, it was recommended he present to the ED for imaging regardless. While there imaging showed  left-sided 3 mm stone in the bladder around the ureterovesical junction with mild left-sided hydronephrosis. Labs showed acute renal failure with Creatinine worsening from 1.17 in February 25, 2023 to around 2.04 in ED, and it did improve somewhat to 1.64 at discharge. Says that after he was discharged he saw an Urologist on Thursday, 7/31 where he had an US  showing no abnormalities, so it's thought that he has likely passed the stone. To stay hydrated, he has been drinking more water, which he normally drinks anyway reportedly.   At that time his blood pressure was elevated in range of 140-160/90-100 and today it is elevated 150/90, 10 minutes later 140/90, 11 minutes later 140/90, and lastly 24 minutes later 150/100. He says that he always tends to have higher blood pressures in medical situations. Past Medical History:  Diagnosis Date   Anxiety    takes Valium  prn   Bronchitis    hx of-last time being about a yr ago   Cataract    developing   Chronic back pain    herniated disc,lumbago,spondylosis   Confusion    hx of-2008   Constipation    d/t meds;taking an OTC stool softener daily(for the past 2 wks)   DDD (degenerative disc disease)     History of kidney stones 25+yrs ago   Hyperlipidemia    takes Simvastatin  daily   Hyperplastic colon polyp    Transient global amnesia   No Known Allergies  Family History  Problem Relation Age of Onset   Heart disease Mother    Hypertension Mother    Breast cancer Mother    Prostate cancer Father    Heart disease Brother    Colon cancer Paternal Uncle    Diabetes Paternal Grandmother    Stroke Paternal Grandmother    Anesthesia problems Neg Hx    Hypotension Neg Hx    Malignant hyperthermia Neg Hx    Pseudochol deficiency Neg Hx    Esophageal cancer Neg Hx    Stomach cancer Neg Hx    Rectal cancer Neg Hx    Colon polyps Neg Hx    Crohn's disease Neg Hx    Ulcerative colitis Neg Hx    Social History   Social History Narrative   Right handed.  Caffeine 1 cup avg daily.  Retired.  Married, 1 daughter.  Graduate school.      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  He is married.  Patient's wife formally worked as a Designer, jewellery in Cardiology at the hospital but retired due to health issues.       Family history: Mother died of complications of dementia.  She also had glaucoma, hypertension and hypothyroidism.  1 brother with history  of MI in his early 14s.  1 sister with history of disabilities passed away after having acute abdominal issue requiring surgery.       Review of Systems  Genitourinary: Negative.   All other systems reviewed and are negative.    Objective:  Vitals: BP (!) 150/100 (BP Location: Left Arm, Patient Position: Sitting)   Pulse 68   Temp 98.3 F (36.8 C)   Ht 5' 8 (1.727 m)   Wt 155 lb (70.3 kg)   SpO2 98%   BMI 23.57 kg/m   Physical Exam Vitals and nursing note reviewed.  Constitutional:      General: He is not in acute distress.    Appearance: Normal appearance. He is not ill-appearing.  HENT:     Head: Normocephalic and atraumatic.  Pulmonary:     Effort: Pulmonary effort is normal.  Skin:     General: Skin is warm and dry.  Neurological:     Mental Status: He is alert and oriented to person, place, and time. Mental status is at baseline.  Psychiatric:        Mood and Affect: Mood normal.        Behavior: Behavior normal.        Thought Content: Thought content normal.        Judgment: Judgment normal.     Results:  Studies Obtained And Personally Reviewed By Me:  CT ABDOMEN PELVIS WO CONTRAST 08/19/2023 FINDINGS:  Lower chest: No acute abnormality.   Hepatobiliary: No focal liver abnormality is seen. No gallstones, gallbladder wall thickening, or biliary dilatation.  Pancreas: Unremarkable. No pancreatic ductal dilatation or surrounding inflammatory changes.   Spleen: Normal in size without focal abnormality.   Adrenals/Urinary Tract: There is a 3 mm calculus in the bladder at the left ureterovesicular junction. There is mild left-sided hydroureteronephrosis with perinephric fat stranding. There are additional punctate nonobstructing right renal calculi. There is no right-sided hydronephrosis. The adrenal glands and bladder are otherwise within normal limits.   Stomach/Bowel: Stomach is within normal limits. Appendix appears normal. No evidence of bowel wall thickening, distention, or inflammatory changes.   Vascular/Lymphatic: Aortic atherosclerosis. No enlarged abdominal or pelvic lymph nodes.   Reproductive: Prostate gland is enlarged.   Other: There is a small fat containing left inguinal hernia. There is no ascites.   Musculoskeletal: There are degenerative changes of the spine.  IMPRESSION:  3 mm calculus in the bladder at the left ureterovesicular junction with mild left-sided hydroureteronephrosis.   Additional nonobstructing right renal calculi.   Aortic atherosclerosis.   At-home Blood Pressure provided by [facility]:  Date Time Blood Pressures  8/01  8:27 AM 129/79  8/02  9:06 PM 125/70  8/04 9:11 AM           8:39 PM 120/64   130/70  8/05   10:30 AM 132/80   Labs:   Hospital Labs from 7/28 - 08/20/2023  08/19/2023 08/20/2023  Sodium     135 - 145 mmol/L 139  143   Potassium     3.5 - 5.1 mmol/L 4.3  4.4   Chloride     98 - 111 mmol/L 103  105   CO2     22 - 32 mmol/L 23  28   Glucose     70 - 99 mg/dL 90  87   BUN     8 - 23 mg/dL 26 (H)  25 (H)   Creatinine     0.61 - 1.24 mg/dL  1.66 (H)  1.64 (H)    2.04 (H)    Calcium     8.9 - 10.3 mg/dL 9.5  9.0   Total Protein     6.5 - 8.1 g/dL 7.2    Albumin     3.5 - 5.0 g/dL 4.3    AST     15 - 41 U/L 27    ALT     0 - 44 U/L 18    Alkaline Phosphatase     38 - 126 U/L 57    Total Bilirubin     0.0 - 1.2 mg/dL 1.3 (H)    GFR, Estimated     >60 mL/min 44 (L)  44 (L)    34 (L)    Anion gap     5 - 15  13  10    Color, Urine     YELLOW  YELLOW    Appearance     CLEAR  CLEAR    Specific Gravity, Urine     1.005 - 1.030  1.026    pH     5.0 - 8.0  5.5    Glucose, UA     NEGATIVE mg/dL NEGATIVE    Hgb urine dipstick     NEGATIVE  NEGATIVE    Bilirubin Urine     NEGATIVE  NEGATIVE    Ketones, ur     NEGATIVE mg/dL 15 !    Protein     NEGATIVE mg/dL NEGATIVE    Nitrite     NEGATIVE  NEGATIVE    Leukocytes,Ua     NEGATIVE  NEGATIVE    WBC     4.0 - 10.5 K/uL 6.3  5.3    8.1    RBC     4.22 - 5.81 MIL/uL 4.41  4.36    4.56    Hemoglobin     13.0 - 17.0 g/dL 85.9  86.0    85.3    HCT     39.0 - 52.0 % 41.9  41.8    43.1    MCV     80.0 - 100.0 fL 95.0  95.9    94.5    MCH     26.0 - 34.0 pg 31.7  31.9    32.0    MCHC     30.0 - 36.0 g/dL 66.5  66.6    66.0    RDW     11.5 - 15.5 % 13.1  13.1    13.2    Platelets     150 - 400 K/uL    165  178    169    nRBC     0.0 - 0.2 %    0.0  0.0    0.0    Lipase     11 - 51 U/L 22    Lactic Acid, Venous     0.5 - 1.9 mmol/L 0.9     1.2      Lab Results  Component Value Date   CHOL 142 02/25/2023   HDL 52 02/25/2023   LDLCALC 77 02/25/2023   TRIG 53 02/25/2023   CHOLHDL 2.7  02/25/2023   Lab Results  Component Value Date   PSA 0.60 02/25/2023   PSA 0.61 02/19/2022   PSA 0.66 02/20/2021     Results for orders placed or performed in visit on 08/27/23  POCT URINALYSIS DIP (CLINITEK)  Result Value Ref Range   Color, UA yellow yellow   Clarity, UA clear  clear   Glucose, UA negative negative mg/dL   Bilirubin, UA negative negative   Ketones, POC UA negative negative mg/dL   Spec Grav, UA 8.989 8.989 - 1.025   Blood, UA negative negative   pH, UA 6.5 5.0 - 8.0   POC PROTEIN,UA negative negative, trace   Urobilinogen, UA 0.2 0.2 or 1.0 E.U./dL   Nitrite, UA Negative Negative   Leukocytes, UA Negative Negative   Assessment & Plan:   Orders Placed This Encounter  Procedures   Basic Metabolic Panel   POCT URINALYSIS DIP (CLINITEK)   Hospital Follow-up from 7/28-7/29; Elevated BUN of 25; Elevated Serum Creatinine of 1.64; AKI (Acute Kidney Injury); Ureteral Stone With Hydronephrosis: He was initially seen in this office on 7/28 w/ complaints of lower left abdominal pain ongoing for 1 week. Though his condition did not seem serious, it was recommended he present to the ED for imaging regardless. While there imaging showed  left-sided 3 mm stone in the bladder around the ureterovesical junction with mild left-sided hydronephrosis. Labs showed acute renal failure with Creatinine worsening from 1.17 in February 25, 2023 to around 2.04 in ED, and it did improve somewhat to 1.64 at discharge. Since discharge, he saw Urology on Thursday, 7/31 where he had an US  showing no abnormalities, so it's thought that he has likely passed the stone. To stay hydrated, he has been drinking more water, which he normally drinks anyway reportedly.   Ordering BMP to monitor renal functions. Will determine next steps based on results.  Elevated Blood Pressure today at 150/90, 10 minutes later 140/90, 11 minutes later 140/90, and lastly 24 minutes later 150/100. Reportedly, he always tends  to have higher blood pressures in medical situations, so likely White Coat Syndrome, especially when comparing his at-home blood pressure which are normal ranging 125-132 systolic and 64-80 diastolic.  Do not feel the need to start him on any medication with at-home blood pressures consistently within normal limits. Will continue to monitor his blood pressures.     I,Emily Lagle,acting as a Neurosurgeon for Ronal JINNY Hailstone, MD.,have documented all relevant documentation on the behalf of Ronal JINNY Hailstone, MD,as directed by  Ronal JINNY Hailstone, MD while in the presence of Ronal JINNY Hailstone, MD.    I, Ronal JINNY Hailstone, MD, have reviewed all documentation for this visit. The documentation on 08/27/2023 for the exam, diagnosis, procedures, and orders are all accurate and complete.

## 2023-08-28 LAB — BASIC METABOLIC PANEL WITH GFR
BUN: 20 mg/dL (ref 7–25)
CO2: 29 mmol/L (ref 20–32)
Calcium: 9.4 mg/dL (ref 8.6–10.3)
Chloride: 105 mmol/L (ref 98–110)
Creat: 1.24 mg/dL (ref 0.70–1.28)
Glucose, Bld: 88 mg/dL (ref 65–99)
Potassium: 4.7 mmol/L (ref 3.5–5.3)
Sodium: 142 mmol/L (ref 135–146)
eGFR: 62 mL/min/1.73m2 (ref >=60)

## 2023-08-29 NOTE — Patient Instructions (Addendum)
 Continue to monitor blood pressure at home.Has seen Urology after hospitalization and it is believed he passed a kidney stone. Reminded to stay well hydrated. Repeat B-met shows improvement in creatinine.

## 2023-12-25 DIAGNOSIS — D485 Neoplasm of uncertain behavior of skin: Secondary | ICD-10-CM | POA: Diagnosis not present

## 2023-12-25 DIAGNOSIS — H02532 Eyelid retraction right lower eyelid: Secondary | ICD-10-CM | POA: Diagnosis not present

## 2023-12-25 DIAGNOSIS — H0279 Other degenerative disorders of eyelid and periocular area: Secondary | ICD-10-CM | POA: Diagnosis not present

## 2023-12-25 DIAGNOSIS — C441222 Squamous cell carcinoma of skin of right lower eyelid, including canthus: Secondary | ICD-10-CM | POA: Diagnosis not present

## 2024-01-06 NOTE — Telephone Encounter (Signed)
 done

## 2024-02-13 NOTE — Progress Notes (Signed)
 "  Annual Wellness Visit   Patient Care Team: Perri Ronal PARAS, MD as PCP - General (Internal Medicine)  Visit Date: 02/27/24   Chief Complaint  Patient presents with   Annual Exam   Medicare Wellness   Subjective:  Patient: Jerry Campbell, Male DOB: 02/05/1950, 74 y.o. MRN: [id number] Vitals:   02/27/24 0955  BP: 110/80   Jerry Campbell is a 74 y.o. Male who presents today for his Annual Wellness Visit. Patient has Hyperplastic colon polyp; HLD (hyperlipidemia); Allergic rhinitis; Lumbar degenerative disc disease; History of syncope; Shoulder pain, left; Cervical disc disease; Transient global amnesia; TGA (transient global amnesia); AKI (acute kidney injury); Ureteral stone with hydronephrosis; and ARF (acute renal failure) on their problem list.  History of Hyperlipidemia treated with Simvastatin  10 mg daily at bedtime. 02/25/23 Lipid Panel: WNL.    History of Chronic Back Pain; DDD treated with Aleve 220 mg as needed for pain. Has noted accompanying positional right shoulder pain.   Presented to the ED on 08/19/2023 for Acute Kidney injury. While there imaging showed  left-sided 3 mm stone in the bladder around the ureterovesical junction with mild left-sided hydronephrosis. Labs showed acute renal failure with Creatinine worsening from 1.17 in February 25, 2023 to around 2.04 in ED, and it did improve somewhat to 1.64 at discharge. Says that after he was discharged he saw an Urologist on Thursday, 7/31 where he had an US  showing no abnormalities, so it's thought that he has likely passed the stone.   Labs 02/25/2024 Total Bilirubin 1.3, Otherwise WNL.   Hx of ureteral stone with hydronephrosis seen by me and subsequently  [facility] July 2025 with no further episodes. Advised to stay well hydrated.    03/13/2022 Colonoscopy One diminutive polyp in the cecum. Resected and retrieved. Two 3 mm polyps in the sigmoid colon. Resected and retrieved. Pathology found to be  adenomatous polyp. Anal papilla( e) was hypertrophied. Internal hemorrhoids. The examination was otherwise normal. Repeat in 7 years.   Vaccine counseling: UTD on Influenza Vaccine.  Health Maintenance  Topic Date Due   Influenza Vaccine  08/23/2023   Medicare Annual Wellness (AWV)  02/26/2024   COVID-19 Vaccine (8 - 2025-26 season) 03/14/2024 (Originally 09/23/2023)   Colonoscopy  03/13/2029   DTaP/Tdap/Td (3 - Td or Tdap) 09/07/2029   Pneumococcal Vaccine: 50+ Years  Completed   Zoster Vaccines- Shingrix  Completed   Meningococcal B Vaccine  Aged Out   Hepatitis C Screening  Discontinued     Review of Systems  Constitutional:  Negative for fever and malaise/fatigue.  HENT:  Negative for congestion.   Eyes:  Negative for blurred vision.  Respiratory:  Negative for cough and shortness of breath.   Cardiovascular:  Negative for chest pain, palpitations and leg swelling.  Gastrointestinal:  Negative for vomiting.  Musculoskeletal:  Negative for back pain.  Skin:  Negative for rash.  Neurological:  Negative for loss of consciousness and headaches.   Objective:  Vitals: body mass index is 24.48 kg/m. Today's Vitals   02/27/24 0955  BP: 110/80  Weight: 161 lb (73 kg)  Height: 5' 8 (1.727 m)   Physical Exam Vitals and nursing note reviewed. Exam conducted with a chaperone present (Araceli Cresco, CMA).  Constitutional:      General: He is awake. He is not in acute distress.    Appearance: Normal appearance. He is not ill-appearing or toxic-appearing.  HENT:     Head: Normocephalic and atraumatic.  Right Ear: Tympanic membrane, ear canal and external ear normal.     Left Ear: Tympanic membrane, ear canal and external ear normal.     Mouth/Throat:     Pharynx: Oropharynx is clear.  Eyes:     Extraocular Movements: Extraocular movements intact.     Pupils: Pupils are equal, round, and reactive to light.  Neck:     Thyroid: No thyroid mass, thyromegaly or  thyroid tenderness.     Vascular: No carotid bruit.  Cardiovascular:     Rate and Rhythm: Normal rate and regular rhythm. No extrasystoles are present.    Pulses:          Dorsalis pedis pulses are 2+ on the right side and 2+ on the left side.       Posterior tibial pulses are 2+ on the right side and 2+ on the left side.     Heart sounds: Normal heart sounds. No murmur heard.    No friction rub. No gallop.  Pulmonary:     Effort: Pulmonary effort is normal.     Breath sounds: Normal breath sounds. No decreased breath sounds, wheezing, rhonchi or rales.  Chest:     Chest wall: No mass.  Abdominal:     Palpations: Abdomen is soft. There is no hepatomegaly, splenomegaly or mass.     Tenderness: There is no abdominal tenderness.     Hernia: No hernia is present.  Genitourinary:    Prostate: Normal. Not enlarged, not tender and no nodules present.     Rectum: Normal. Guaiac result negative.  Musculoskeletal:     Cervical back: Normal range of motion.     Right lower leg: No edema.     Left lower leg: No edema.  Lymphadenopathy:     Cervical: No cervical adenopathy.     Upper Body:     Right upper body: No supraclavicular adenopathy.     Left upper body: No supraclavicular adenopathy.  Skin:    General: Skin is warm and dry.  Neurological:     General: No focal deficit present.     Mental Status: He is alert and oriented to person, place, and time. Mental status is at baseline.     Cranial Nerves: Cranial nerves 2-12 are intact.     Sensory: Sensation is intact.     Motor: Motor function is intact.     Coordination: Coordination is intact.     Gait: Gait is intact.     Deep Tendon Reflexes: Reflexes are normal and symmetric.  Psychiatric:        Attention and Perception: Attention normal.        Mood and Affect: Mood normal.        Speech: Speech normal.        Behavior: Behavior normal. Behavior is cooperative.        Thought Content: Thought content normal.         Cognition and Memory: Cognition and memory normal.        Judgment: Judgment normal.     Current Outpatient Medications  Medication Instructions   acetaminophen  (TYLENOL ) 500-1,000 mg, Every 6 hours PRN   cholecalciferol (VITAMIN D3) 2,000 Units, Daily   COCONUT OIL PO Take by mouth. 1000 mg bedtime   docusate sodium (COLACE) 100 mg, 2 times daily PRN   fish oil-omega-3 fatty acids 1 g, 2 times daily   MELATONIN PO 1 tablet, As needed   Multiple Vitamins-Minerals (MULTIVITAMIN WITH MINERALS) tablet 1 tablet, Daily  simvastatin  (ZOCOR ) 10 MG tablet TAKE 1 TABLET BY MOUTH EVERYDAY AT BEDTIME   tretinoin (RETIN-A) 0.025 % cream 1 Application, At bedtime PRN   Past Medical History:  Diagnosis Date   Anxiety    takes Valium  prn   Bronchitis    hx of-last time being about a yr ago   Cataract    developing   Chronic back pain    herniated disc,lumbago,spondylosis   Confusion    hx of-2008   Constipation    d/t meds;taking an OTC stool softener daily(for the past 2 wks)   DDD (degenerative disc disease)    History of kidney stones 25+yrs ago   Hyperlipidemia    takes Simvastatin  daily   Hyperplastic colon polyp    Transient global amnesia    Medical/Surgical History Narrative:  Allergic/Intolerant to: Allergies[1] February 25, 2018he was on a ladder and fell some 10 to 12 feet tearing his ACL and strained his MCL. Surgery was not required. Occasionally knee will give away with him.  He has had 4 episodes of transient global amnesia evaluated by cardiology and neurology. The last episode occurred in 03/18/13 when his mother passed away. He was hospitalized briefly at that time and had negative MRI of the brain. He has had extensive evaluation for this.  History of left shoulder pain status post shoulder fracture in 03-19-11.  Had MRI of the neck in 19-Mar-2011 showing cervical disc disease with mild impingement and multilevel involvement.  History of lumbar back surgery for  herniated disc L5-S1 by Dr. Colon.  Vasectomy in 1989-03-18.   History of hyperplastic colon polyps.  No known drug allergies.  Thinks he perhaps suffered a concussion from a fall from a ladder in March 18, 1993.History of fractured left wrist secondary to a fall in 1993-03-18.   Fractured left fifth toe in the 1980s. History of fractured right toe in karate in the early 1990s. Fractured left shoulder with possible rotator cuff tear December 2005.  Past Surgical History:  Procedure Laterality Date   COLONOSCOPY     LUMBAR LAMINECTOMY/DECOMPRESSION MICRODISCECTOMY  04/09/2011   Procedure: LUMBAR LAMINECTOMY/DECOMPRESSION MICRODISCECTOMY;  Surgeon: Victory Colon, MD;  Location: [facility];  Service: Neurosurgery;  Laterality: Left;  Left Lumbar five - sacral one Microdiskectomy   UMBILICAL HERNIA REPAIR  10/10/2010   VASECTOMY  38yrs ago   wisdom teeth extracted  early 77's   Family History  Problem Relation Age of Onset   Heart disease Mother    Hypertension Mother    Breast cancer Mother    Prostate cancer Father    Heart disease Brother    Colon cancer Paternal Uncle    Diabetes Paternal Grandmother    Stroke Paternal Grandmother    Anesthesia problems Neg Hx    Hypotension Neg Hx    Malignant hyperthermia Neg Hx    Pseudochol deficiency Neg Hx    Esophageal cancer Neg Hx    Stomach cancer Neg Hx    Rectal cancer Neg Hx    Colon polyps Neg Hx    Crohn's disease Neg Hx    Ulcerative colitis Neg Hx    Social History   Social History Narrative   Right handed.  Caffeine 1 cup avg daily.  Retired.  Married, 1 daughter.  Graduate school.      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  He is married.  Patient's wife formally worked as a Designer, Jewellery in Cardiology at the hospital but  retired due to health issues.       Family history: Mother died of complications of dementia.  She also had glaucoma, hypertension and hypothyroidism.  1  brother with history of MI in his early 84s.  1 sister with history of disabilities passed away after having acute abdominal issue requiring surgery.       Most Recent Health Risks Assessment:   Most Recent Social Determinants of Health (Including Hx of Tobacco, Alcohol, and Drug Use) SDOH Screenings   Food Insecurity: No Food Insecurity (02/27/2024)  Housing: Low Risk (02/27/2024)  Transportation Needs: No Transportation Needs (02/27/2024)  Utilities: Not At Risk (02/27/2024)  Alcohol Screen: Low Risk (02/26/2023)  Depression (PHQ2-9): Low Risk (02/27/2024)  Financial Resource Strain: Low Risk (02/26/2023)  Physical Activity: Insufficiently Active (02/27/2024)  Social Connections: Socially Integrated (02/27/2024)  Stress: No Stress Concern Present (02/27/2024)  Tobacco Use: Low Risk (02/27/2024)  Health Literacy: Adequate Health Literacy (02/27/2024)   Social History[2] Most Recent Functional Status Assessment:    08/20/2023    5:30 AM  In your present state of health, do you have any difficulty performing the following activities:  Hearing? 0   Vision? 0   Difficulty concentrating or making decisions? 0     Data saved with a previous flowsheet row definition   Most Recent Fall Risk Assessment:    02/27/2024   10:11 AM  Fall Risk   Falls in the past year? 1  Number falls in past yr: 0  Injury with Fall? 0  Risk for fall due to : No Fall Risks  Follow up Falls prevention discussed;Education provided;Falls evaluation completed   Most Recent Anxiety/Depression Screenings:    02/27/2024   10:07 AM 02/26/2023   10:02 AM  PHQ 2/9 Scores  PHQ - 2 Score 0 0  PHQ- 9 Score 0     Most Recent Cognitive Screening:    02/22/2022   10:06 AM  6CIT Screen  What Year? 0 points  What month? 0 points  What time? 0 points  Count back from 20 0 points  Months in reverse 0 points  Repeat phrase 0 points  Total Score 0 points    Results:  Studies Obtained And Personally Reviewed By Me:  03/13/2022  Colonoscopy One diminutive polyp in the cecum. Resected and retrieved. Two 3 mm polyps in the sigmoid colon. Resected and retrieved. Pathology found to be adenomatous polyp. Anal papilla( e) was hypertrophied. Internal hemorrhoids. The examination was otherwise normal. Repeat in 7 years.  Labs:  CBC w/ Differential Lab Results  Component Value Date   WBC 5.3 02/25/2024   RBC 5.04 02/25/2024   HGB 15.8 02/25/2024   HCT 48.2 02/25/2024   PLT 204 02/25/2024   MCV 95.6 02/25/2024   MCH 31.3 02/25/2024   MCHC 32.8 02/25/2024   RDW 12.8 02/25/2024   MPV 10.7 02/25/2024   LYMPHSABS 1,462 02/19/2022   MONOABS 318 12/19/2015   BASOSABS 48 02/25/2024    Comprehensive Metabolic Panel Lab Results  Component Value Date   NA 141 02/25/2024   K 4.4 02/25/2024   CL 103 02/25/2024   CO2 31 02/25/2024   GLUCOSE 88 02/25/2024   BUN 19 02/25/2024   CREATININE 1.25 02/25/2024   CALCIUM 9.7 02/25/2024   PROT 6.8 02/25/2024   ALBUMIN 4.3 08/19/2023   AST 22 02/25/2024   ALT 19 02/25/2024   ALKPHOS 57 08/19/2023   BILITOT 1.3 (H) 02/25/2024   EGFR 62 02/25/2024   GFRNONAA 44 (L) 08/20/2023  Lipid Panel  Lab Results  Component Value Date   CHOL 158 02/25/2024   HDL 55 02/25/2024   LDLCALC 87 02/25/2024   TRIG 75 02/25/2024   PSA 0.53  Assessment & Plan:   Hyperlipidemia: treated with Simvastatin  10 mg daily at bedtime. 02/25/23 Lipid Panel: WNL.    Chronic Back Pain; DDD: treated with Aleve 220 mg as needed for pain. Has noted accompanying positional right shoulder pain.    03/13/2022 Colonoscopy One diminutive polyp in the cecum. Resected and retrieved. Two 3 mm polyps in the sigmoid colon. Resected and retrieved. Pathology found to be adenomatous polyp. Anal papilla( e) was hypertrophied. Internal hemorrhoids. The examination was otherwise normal. Repeat in 7 years.   Vaccine counseling: UTD on Influenza Vaccine.    Return in about 1 year (around 02/26/2025).   Annual Wellness  Visit done today including the all of the following: Reviewed patient's Family Medical History Reviewed patient's SDOH and reviewed tobacco, alcohol, and drug use.  Reviewed and updated list of patient's medical providers Assessment of cognitive impairment was done Assessed patient's functional ability Established a written schedule for health screening services Health Risk Assessent Completed and Reviewed  Discussed health benefits of physical activity, and encouraged him to engage in regular exercise appropriate for his age and condition.   I,Makayla C Reid,acting as a scribe for Ronal JINNY Hailstone, MD.,have documented all relevant documentation on the behalf of Ronal JINNY Hailstone, MD,as directed by  Ronal JINNY Hailstone, MD while in the presence of Ronal JINNY Hailstone, MD.  I, Ronal JINNY Hailstone, MD, have reviewed all documentation for and agree with the above Annual Wellness Visit documentation.  Ronal JINNY Hailstone, MD Internal Medicine 02/27/2024       [1] No Known Allergies [2] Social History Tobacco Use   Smoking status: Never   Smokeless tobacco: Never  Vaping Use   Vaping status: Never Used  Substance Use Topics   Alcohol use: Yes    Alcohol/week: 2.0 standard drinks of alcohol    Types: 2 Cans of beer per week    Comment: 1-2 per week   Drug use: No  "

## 2024-02-24 ENCOUNTER — Other Ambulatory Visit: Payer: Medicare HMO

## 2024-02-24 DIAGNOSIS — Z13220 Encounter for screening for lipoid disorders: Secondary | ICD-10-CM

## 2024-02-24 DIAGNOSIS — E7800 Pure hypercholesterolemia, unspecified: Secondary | ICD-10-CM

## 2024-02-24 DIAGNOSIS — Z0000 Encounter for general adult medical examination without abnormal findings: Secondary | ICD-10-CM

## 2024-02-24 DIAGNOSIS — Z125 Encounter for screening for malignant neoplasm of prostate: Secondary | ICD-10-CM

## 2024-02-25 ENCOUNTER — Other Ambulatory Visit

## 2024-02-25 DIAGNOSIS — E7800 Pure hypercholesterolemia, unspecified: Secondary | ICD-10-CM

## 2024-02-25 DIAGNOSIS — Z125 Encounter for screening for malignant neoplasm of prostate: Secondary | ICD-10-CM

## 2024-02-25 DIAGNOSIS — Z0000 Encounter for general adult medical examination without abnormal findings: Secondary | ICD-10-CM

## 2024-02-25 LAB — CBC WITH DIFFERENTIAL/PLATELET
Absolute Lymphocytes: 1659 {cells}/uL (ref 850–3900)
Absolute Monocytes: 329 {cells}/uL (ref 200–950)
Basophils Absolute: 48 {cells}/uL (ref 0–200)
Basophils Relative: 0.9 %
Eosinophils Absolute: 69 {cells}/uL (ref 15–500)
Eosinophils Relative: 1.3 %
HCT: 48.2 % (ref 39.4–51.1)
Hemoglobin: 15.8 g/dL (ref 13.2–17.1)
MCH: 31.3 pg (ref 27.0–33.0)
MCHC: 32.8 g/dL (ref 31.6–35.4)
MCV: 95.6 fL (ref 81.4–101.7)
MPV: 10.7 fL (ref 7.5–12.5)
Monocytes Relative: 6.2 %
Neutro Abs: 3196 {cells}/uL (ref 1500–7800)
Neutrophils Relative %: 60.3 %
Platelets: 204 10*3/uL (ref 140–400)
RBC: 5.04 Million/uL (ref 4.20–5.80)
RDW: 12.8 % (ref 11.0–15.0)
Total Lymphocyte: 31.3 %
WBC: 5.3 10*3/uL (ref 3.8–10.8)

## 2024-02-25 LAB — PSA: PSA: 0.53 ng/mL

## 2024-02-25 LAB — LIPID PANEL
Cholesterol: 158 mg/dL
HDL: 55 mg/dL
LDL Cholesterol (Calc): 87 mg/dL
Non-HDL Cholesterol (Calc): 103 mg/dL
Total CHOL/HDL Ratio: 2.9 (calc)
Triglycerides: 75 mg/dL

## 2024-02-25 LAB — COMPREHENSIVE METABOLIC PANEL WITH GFR
AG Ratio: 2.1 (calc) (ref 1.0–2.5)
ALT: 19 U/L (ref 9–46)
AST: 22 U/L (ref 10–35)
Albumin: 4.6 g/dL (ref 3.6–5.1)
Alkaline phosphatase (APISO): 45 U/L (ref 35–144)
BUN: 19 mg/dL (ref 7–25)
CO2: 31 mmol/L (ref 20–32)
Calcium: 9.7 mg/dL (ref 8.6–10.3)
Chloride: 103 mmol/L (ref 98–110)
Creat: 1.25 mg/dL (ref 0.70–1.28)
Globulin: 2.2 g/dL (ref 1.9–3.7)
Glucose, Bld: 88 mg/dL (ref 65–99)
Potassium: 4.4 mmol/L (ref 3.5–5.3)
Sodium: 141 mmol/L (ref 135–146)
Total Bilirubin: 1.3 mg/dL — ABNORMAL HIGH (ref 0.2–1.2)
Total Protein: 6.8 g/dL (ref 6.1–8.1)
eGFR: 62 mL/min/{1.73_m2}

## 2024-02-25 NOTE — Progress Notes (Unsigned)
 Lab only

## 2024-02-27 ENCOUNTER — Encounter: Payer: Self-pay | Admitting: Internal Medicine

## 2024-02-27 ENCOUNTER — Ambulatory Visit: Payer: Medicare HMO | Admitting: Internal Medicine

## 2024-02-27 VITALS — BP 110/80 | Ht 68.0 in | Wt 161.0 lb

## 2024-02-27 DIAGNOSIS — Z87442 Personal history of urinary calculi: Secondary | ICD-10-CM

## 2024-02-27 DIAGNOSIS — Z0000 Encounter for general adult medical examination without abnormal findings: Secondary | ICD-10-CM

## 2024-02-27 DIAGNOSIS — E7800 Pure hypercholesterolemia, unspecified: Secondary | ICD-10-CM

## 2024-02-27 DIAGNOSIS — Z8739 Personal history of other diseases of the musculoskeletal system and connective tissue: Secondary | ICD-10-CM

## 2024-02-27 LAB — POCT URINALYSIS DIP (CLINITEK)
Bilirubin, UA: NEGATIVE
Blood, UA: NEGATIVE
Glucose, UA: NEGATIVE mg/dL
Ketones, POC UA: NEGATIVE mg/dL
Leukocytes, UA: NEGATIVE
Nitrite, UA: NEGATIVE
POC PROTEIN,UA: NEGATIVE
Spec Grav, UA: 1.01
Urobilinogen, UA: 0.2 U/dL
pH, UA: 6.5

## 2024-02-27 NOTE — Progress Notes (Unsigned)
 "  Chief Complaint  Patient presents with   Annual Exam   Medicare Wellness     Subjective:   Jerry Campbell is a 74 y.o. male who presents for a Medicare Annual Wellness Visit.  Visit info / Clinical Intake: Medicare Wellness Visit Type:: Subsequent Annual Wellness Visit Persons participating in visit and providing information:: patient Medicare Wellness Visit Mode:: In-person (required for WTM) Interpreter Needed?: No Pre-visit prep was completed: yes AWV questionnaire completed by patient prior to visit?: no Living arrangements:: lives with spouse/significant other Patient's Overall Health Status Rating: very good Typical amount of pain: none Does pain affect daily life?: no Are you currently prescribed opioids?: no  Dietary Habits and Nutritional Risks How many meals a day?: 3 Eats fruit and vegetables daily?: yes Most meals are obtained by: preparing own meals In the last 2 weeks, have you had any of the following?: none Diabetic:: no  Functional Status Activities of Daily Living (to include ambulation/medication): Independent Ambulation: Independent Medication Administration: Independent Home Management (perform basic housework or laundry): Independent Manage your own finances?: yes Primary transportation is: driving Concerns about vision?: no *vision screening is required for WTM* Concerns about hearing?: no  Fall Screening Falls in the past year?: 1 Number of falls in past year: 0 Was there an injury with Fall?: 0 Fall Risk Category Calculator: 1 Patient Fall Risk Level: Low Fall Risk  Fall Risk Patient at Risk for Falls Due to: No Fall Risks Fall risk Follow up: Falls prevention discussed; Education provided; Falls evaluation completed  Home and Transportation Safety: All rugs have non-skid backing?: yes All stairs or steps have railings?: yes Grab bars in the bathtub or shower?: yes Have non-skid surface in bathtub or shower?: yes Good home  lighting?: yes Regular seat belt use?: yes Hospital stays in the last year:: (!) yes How many hospital stays:: 1 Reason: Kidney stone  Cognitive Assessment Difficulty concentrating, remembering, or making decisions? : no Will 6CIT or Mini Cog be Completed: no 6CIT or Mini Cog Declined: patient alert, oriented, able to answer questions appropriately and recall recent events  Advance Directives (For Healthcare) Does Patient Have a Medical Advance Directive?: Yes Does patient want to make changes to medical advance directive?: No - Patient declined Type of Advance Directive: Living will; Healthcare Power of Attorney Copy of Healthcare Power of Attorney in Chart?: No - copy requested Copy of Living Will in Chart?: No - copy requested Would patient like information on creating a medical advance directive?: No - Patient declined  Reviewed/Updated  Reviewed/Updated: Reviewed All (Medical, Surgical, Family, Medications, Allergies, Care Teams, Patient Goals)    Allergies (verified) Patient has no known allergies.   Current Medications (verified) Outpatient Encounter Medications as of 02/27/2024  Medication Sig   acetaminophen  (TYLENOL ) 500 MG tablet Take 500-1,000 mg by mouth every 6 (six) hours as needed for moderate pain (pain score 4-6).   cholecalciferol (VITAMIN D3) 25 MCG (1000 UNIT) tablet Take 2,000 Units by mouth daily.   COCONUT OIL PO Take by mouth. 1000 mg bedtime   docusate sodium (COLACE) 100 MG capsule Take 100 mg by mouth 2 (two) times daily as needed for moderate constipation.   fish oil-omega-3 fatty acids 1000 MG capsule Take 1 g by mouth 2 (two) times daily.   MELATONIN PO Take 1 tablet by mouth as needed. Reported on 02/16/2015   Multiple Vitamins-Minerals (MULTIVITAMIN WITH MINERALS) tablet Take 1 tablet by mouth daily.   simvastatin  (ZOCOR ) 10 MG tablet TAKE 1  TABLET BY MOUTH EVERYDAY AT BEDTIME   tretinoin (RETIN-A) 0.025 % cream Apply 1 Application topically at  bedtime as needed (Acne).   No facility-administered encounter medications on file as of 02/27/2024.    History: Past Medical History:  Diagnosis Date   Anxiety    takes Valium  prn   Bronchitis    hx of-last time being about a yr ago   Cataract    developing   Chronic back pain    herniated disc,lumbago,spondylosis   Confusion    hx of-2008   Constipation    d/t meds;taking an OTC stool softener daily(for the past 2 wks)   DDD (degenerative disc disease)    History of kidney stones 25+yrs ago   Hyperlipidemia    takes Simvastatin  daily   Hyperplastic colon polyp    Transient global amnesia    Past Surgical History:  Procedure Laterality Date   COLONOSCOPY     LUMBAR LAMINECTOMY/DECOMPRESSION MICRODISCECTOMY  04/09/2011   Procedure: LUMBAR LAMINECTOMY/DECOMPRESSION MICRODISCECTOMY;  Surgeon: Victory Gens, MD;  Location: [facility];  Service: Neurosurgery;  Laterality: Left;  Left Lumbar five - sacral one Microdiskectomy   UMBILICAL HERNIA REPAIR  10/10/2010   VASECTOMY  77yrs ago   wisdom teeth extracted  early 36's   Family History  Problem Relation Age of Onset   Heart disease Mother    Hypertension Mother    Breast cancer Mother    Prostate cancer Father    Heart disease Brother    Colon cancer Paternal Uncle    Diabetes Paternal Grandmother    Stroke Paternal Grandmother    Anesthesia problems Neg Hx    Hypotension Neg Hx    Malignant hyperthermia Neg Hx    Pseudochol deficiency Neg Hx    Esophageal cancer Neg Hx    Stomach cancer Neg Hx    Rectal cancer Neg Hx    Colon polyps Neg Hx    Crohn's disease Neg Hx    Ulcerative colitis Neg Hx    Social History   Occupational History   Not on file  Tobacco Use   Smoking status: Never   Smokeless tobacco: Never  Vaping Use   Vaping status: Never Used  Substance and Sexual Activity   Alcohol use: Yes    Alcohol/week: 2.0 standard drinks of alcohol    Types: 2 Cans of beer per week    Comment: 1-2 per  week   Drug use: No   Sexual activity: Yes   Tobacco Counseling Counseling given: No  SDOH Screenings   Food Insecurity: No Food Insecurity (02/27/2024)  Housing: Low Risk (02/27/2024)  Transportation Needs: No Transportation Needs (02/27/2024)  Utilities: Not At Risk (02/27/2024)  Alcohol Screen: Low Risk (02/26/2023)  Depression (PHQ2-9): Low Risk (02/27/2024)  Financial Resource Strain: Low Risk (02/26/2023)  Physical Activity: Insufficiently Active (02/27/2024)  Social Connections: Socially Integrated (02/27/2024)  Stress: No Stress Concern Present (02/27/2024)  Tobacco Use: Low Risk (02/27/2024)  Health Literacy: Adequate Health Literacy (02/27/2024)   See flowsheets for full screening details  Depression Screen PHQ 2 & 9 Depression Scale- Over the past 2 weeks, how often have you been bothered by any of the following problems? Little interest or pleasure in doing things: 0 Feeling down, depressed, or hopeless (PHQ Adolescent also includes...irritable): 0 PHQ-2 Total Score: 0 Trouble falling or staying asleep, or sleeping too much: 0 Feeling tired or having little energy: 0 Poor appetite or overeating (PHQ Adolescent also includes...weight loss): 0 Feeling bad about yourself -  or that you are a failure or have let yourself or your family down: 0 Trouble concentrating on things, such as reading the newspaper or watching television (PHQ Adolescent also includes...like school work): 0 Moving or speaking so slowly that other people could have noticed. Or the opposite - being so fidgety or restless that you have been moving around a lot more than usual: 0 Thoughts that you would be better off dead, or of hurting yourself in some way: 0 PHQ-9 Total Score: 0 If you checked off any problems, how difficult have these problems made it for you to do your work, take care of things at home, or get along with other people?: Not difficult at all     Goals Addressed               This Visit's Progress      Exercise 3x per week (30 min per time) (pt-stated)               Objective:    Today's Vitals   02/27/24 0955  BP: 110/80  Weight: 161 lb (73 kg)  Height: 5' 8 (1.727 m)   Body mass index is 24.48 kg/m.  Hearing/Vision screen Vision Screening - Comments:: Patient states his last eye exam was done in the summer 2025 at [facility] care  Immunizations and Health Maintenance Health Maintenance  Topic Date Due   Influenza Vaccine  08/23/2023   Medicare Annual Wellness (AWV)  02/26/2024   COVID-19 Vaccine (8 - 2025-26 season) 03/14/2024 (Originally 09/23/2023)   Colonoscopy  03/13/2029   DTaP/Tdap/Td (3 - Td or Tdap) 09/07/2029   Pneumococcal Vaccine: 50+ Years  Completed   Zoster Vaccines- Shingrix  Completed   Meningococcal B Vaccine  Aged Out   Hepatitis C Screening  Discontinued        Assessment/Plan:  This is a routine wellness examination for Jerry Campbell.  Patient Care Team: Perri Ronal PARAS, MD as PCP - General (Internal Medicine)  I have personally reviewed and noted the following in the patients chart:   Medical and social history Use of alcohol, tobacco or illicit drugs  Current medications and supplements including opioid prescriptions. Functional ability and status Nutritional status Physical activity Advanced directives List of other physicians Hospitalizations, surgeries, and ER visits in previous 12 months Vitals Screenings to include cognitive, depression, and falls Referrals and appointments  No orders of the defined types were placed in this encounter.  In addition, I have reviewed and discussed with patient certain preventive protocols, quality metrics, and best practice recommendations. A written personalized care plan for preventive services as well as general preventive health recommendations were provided to patient.   Jerry Campbell, CMA   02/27/2024   Return in about 1 year (around 02/26/2025).  After Visit Summary: (In Person-Printed)  AVS printed and given to the patient  Nurse Notes: none  "

## 2024-02-27 NOTE — Patient Instructions (Addendum)
 Mr. Giampietro,  Thank you for taking the time for your Medicare Wellness Visit. I appreciate your continued commitment to your health goals. Please review the care plan we discussed, and feel free to reach out if I can assist you further.  Please note that Annual Wellness Visits do not include a physical exam. Some assessments may be limited, especially if the visit was conducted virtually. If needed, we may recommend an in-person follow-up with your provider.  Ongoing Care Seeing your primary care provider every 3 to 6 months helps us  monitor your health and provide consistent, personalized care.   Referrals If a referral was made during today's visit and you haven't received any updates within two weeks, please contact the referred provider directly to check on the status.  Recommended Screenings:  Health Maintenance  Topic Date Due   Flu Shot  08/23/2023   COVID-19 Vaccine (8 - 2025-26 season) 09/23/2023   Medicare Annual Wellness Visit  02/26/2024   Colon Cancer Screening  03/13/2029   DTaP/Tdap/Td vaccine (3 - Td or Tdap) 09/07/2029   Pneumococcal Vaccine for age over 45  Completed   Zoster (Shingles) Vaccine  Completed   Meningitis B Vaccine  Aged Out   Hepatitis C Screening  Discontinued       02/27/2024   10:11 AM  Advanced Directives  Does Patient Have a Medical Advance Directive? Yes  Type of Advance Directive Living will;Healthcare Power of Attorney  Does patient want to make changes to medical advance directive? No - Patient declined  Copy of Healthcare Power of Attorney in Chart? No - copy requested    Vision: Annual vision screenings are recommended for early detection of glaucoma, cataracts, and diabetic retinopathy. These exams can also reveal signs of chronic conditions such as diabetes and high blood pressure.  Dental: Annual dental screenings help detect early signs of oral cancer, gum disease, and other conditions linked to overall health, including heart disease  and diabetes.  Please see the attached documents for additional preventive care recommendations.    It was a pleasure to see you today. Labs are stable. Vaccines discussed. Return in one year or as needed. Continue Simvastatin .
